# Patient Record
Sex: Male | Born: 1940 | Hispanic: Yes | Marital: Married | Smoking: Never smoker
Health system: Southern US, Community
[De-identification: ages and names within clinical notes are randomized; demographics above are authoritative.]

## PROBLEM LIST (undated history)

## (undated) DIAGNOSIS — I10 Essential (primary) hypertension: Secondary | ICD-10-CM

## (undated) DIAGNOSIS — I4891 Unspecified atrial fibrillation: Secondary | ICD-10-CM

## (undated) DIAGNOSIS — E785 Hyperlipidemia, unspecified: Secondary | ICD-10-CM

## (undated) DIAGNOSIS — E119 Type 2 diabetes mellitus without complications: Secondary | ICD-10-CM

## (undated) DIAGNOSIS — Z950 Presence of cardiac pacemaker: Secondary | ICD-10-CM

---

## 2005-09-21 ENCOUNTER — Other Ambulatory Visit: Payer: Self-pay

## 2005-09-21 ENCOUNTER — Inpatient Hospital Stay: Payer: Self-pay | Admitting: Internal Medicine

## 2005-09-22 ENCOUNTER — Other Ambulatory Visit: Payer: Self-pay

## 2005-09-23 ENCOUNTER — Other Ambulatory Visit: Payer: Self-pay

## 2007-03-22 ENCOUNTER — Other Ambulatory Visit: Payer: Self-pay

## 2007-03-22 ENCOUNTER — Emergency Department: Payer: Self-pay

## 2008-07-25 IMAGING — CT CT HEAD WITHOUT CONTRAST
2 series · 16 of 30 positions shown, 20 images · non-contrast
Comparison: none

REASON FOR EXAM: Transient altered mental status
COMMENTS:

PROCEDURE:     CT  - CT HEAD WITHOUT CONTRAST  - March 22, 2007  [DATE]
RESULT:     The patient has a history of transient altered mental status.
TECHNIQUE: Nonenhanced head CT is performed.

[Series 2: without · axial · non-contrast · 0.39mm/px · z∈[+888,+1018]mm · 13 of 32 slices shown, 17 images]
[im 3/32  brain]
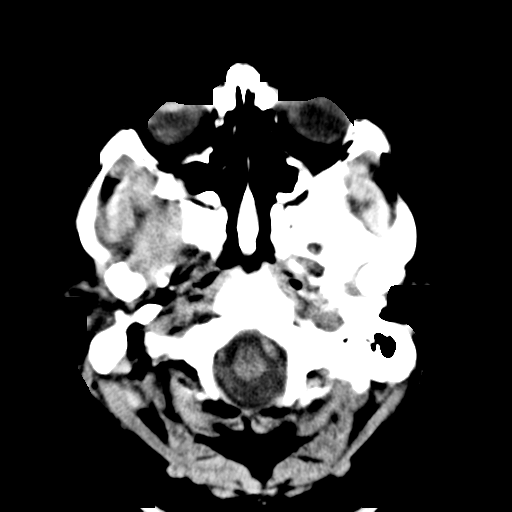
[im 3/32  bone]
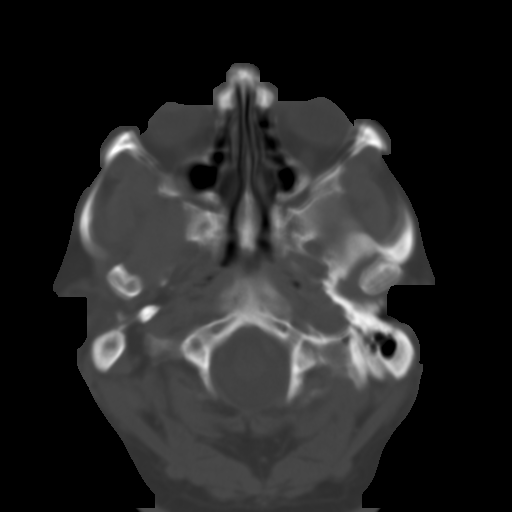
[im 5/32  brain]
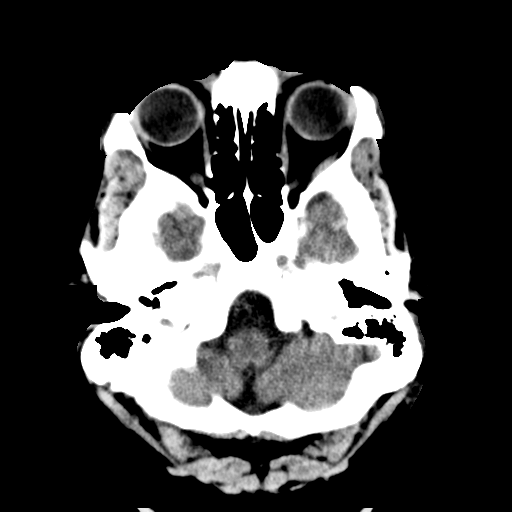
[im 7/32  brain]
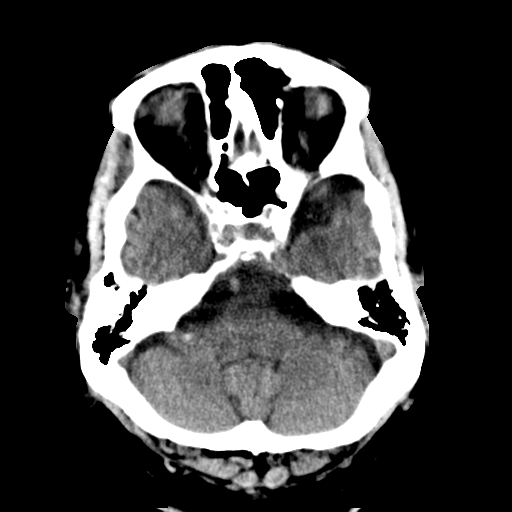
[im 9/32  brain]
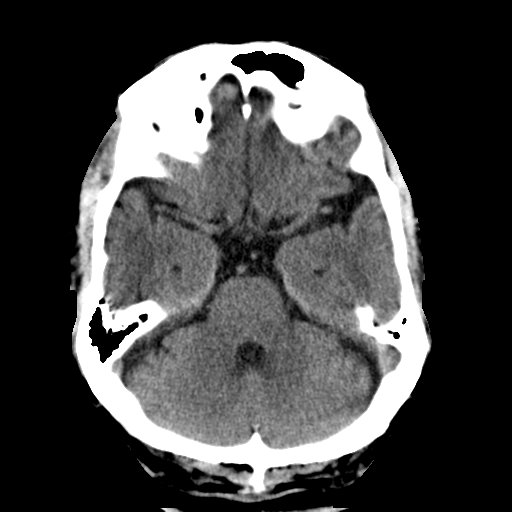
[im 12/32  brain]
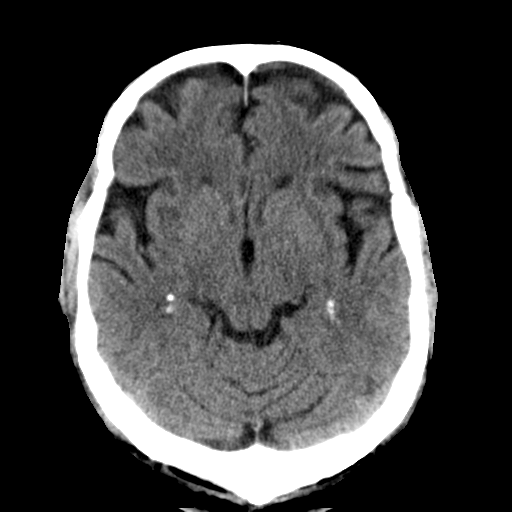
[im 12/32  bone]
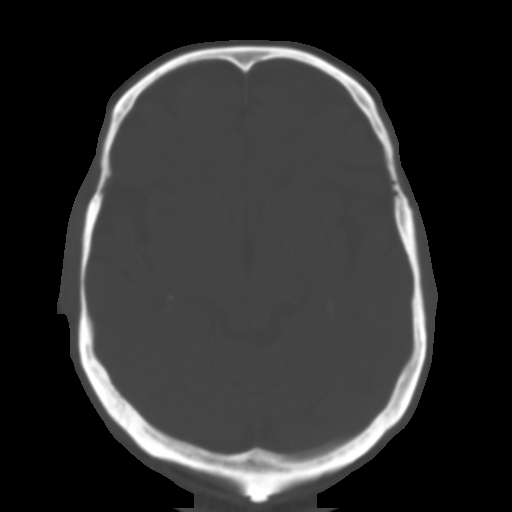
[im 14/32  brain]
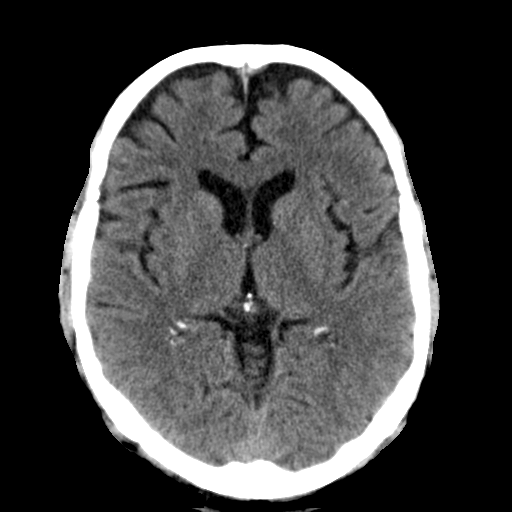
[im 16/32  brain]
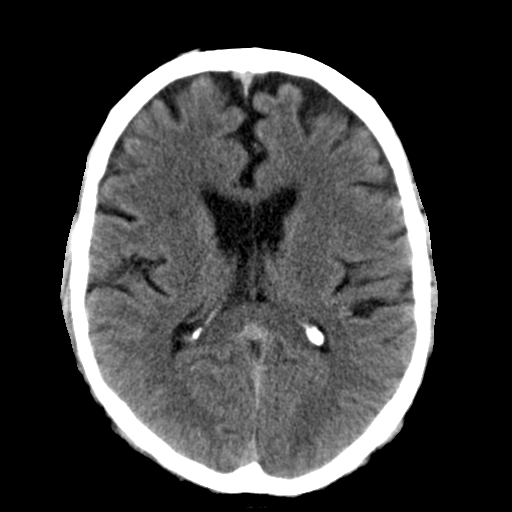
[im 18/32  brain]
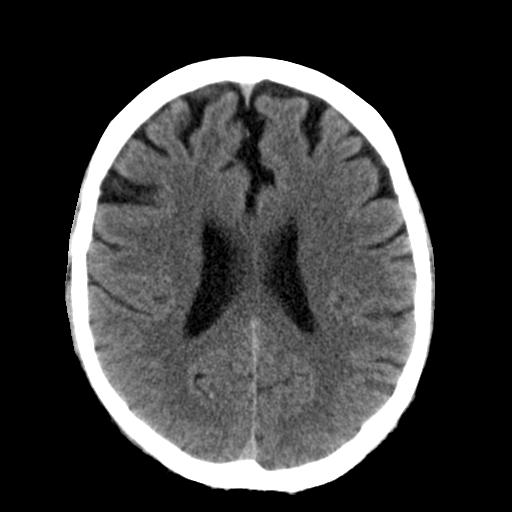
[im 20/32  brain]
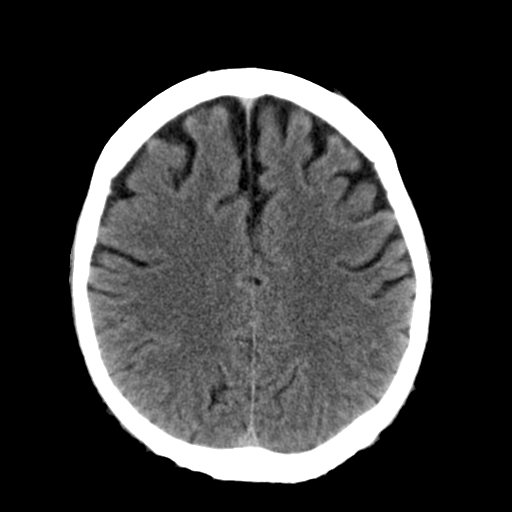
[im 20/32  bone]
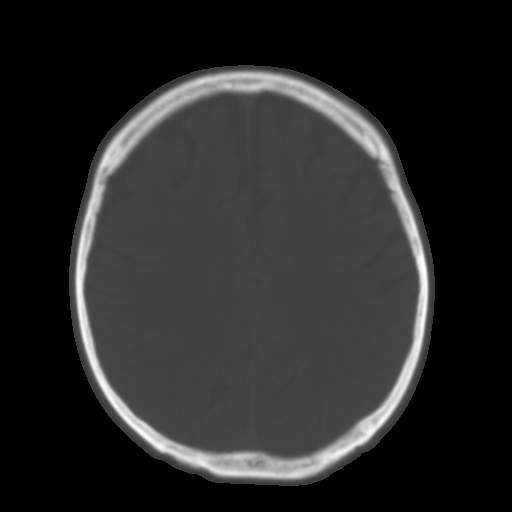
[im 23/32  brain]
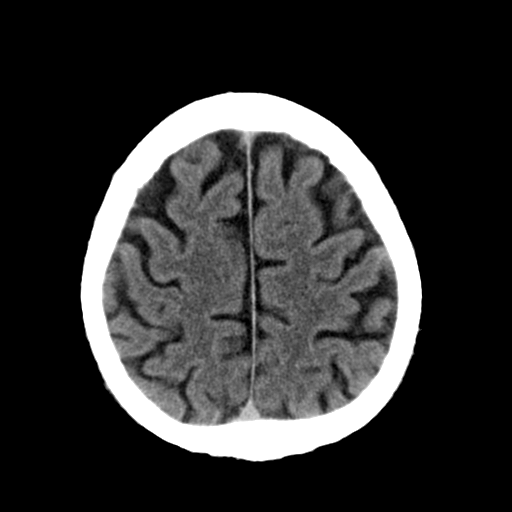
[im 25/32  brain]
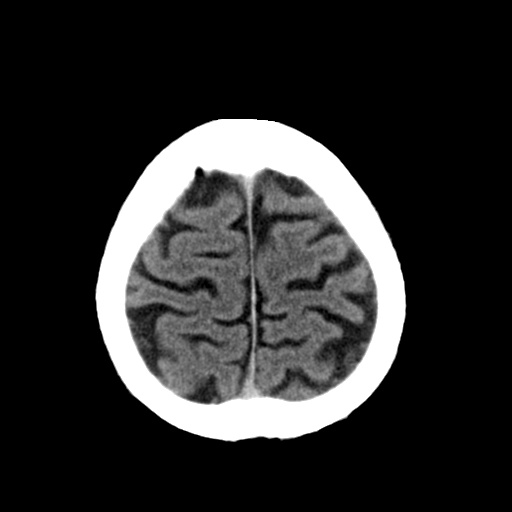
[im 27/32  brain]
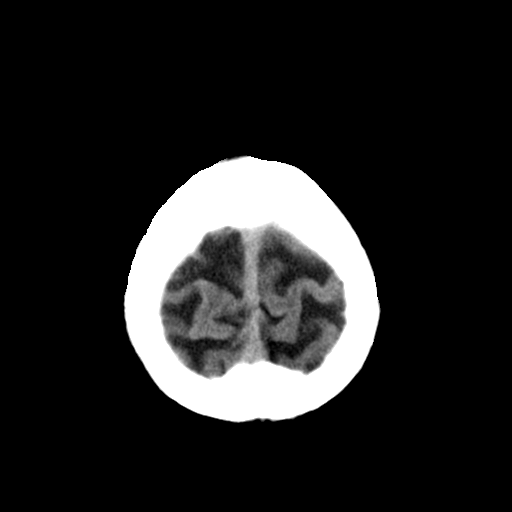
[im 29/32  brain]
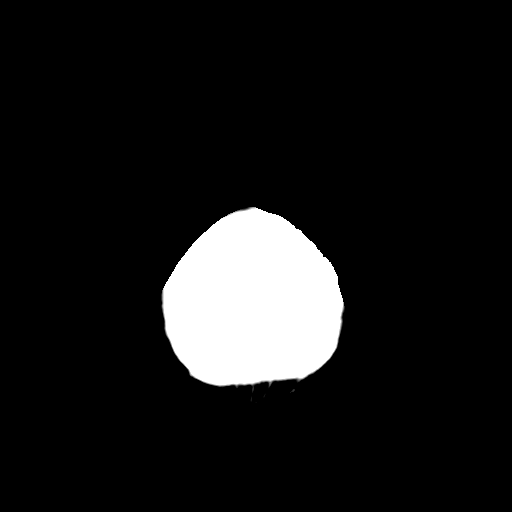
[im 29/32  bone]
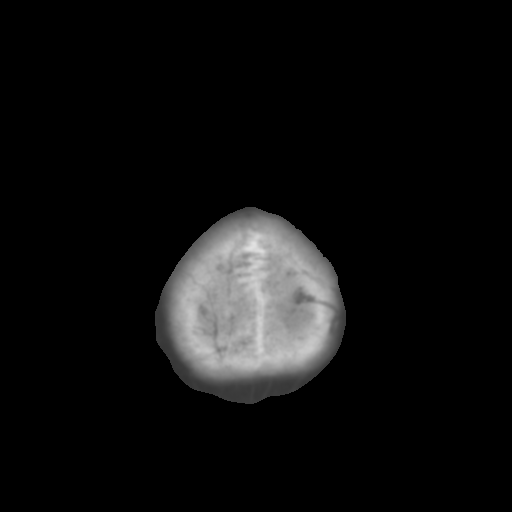

[Series 3: bone · axial · 0.39mm/px · z∈[+888,+933]mm · 3 of 32 slices shown]
[im 3/32  bone]
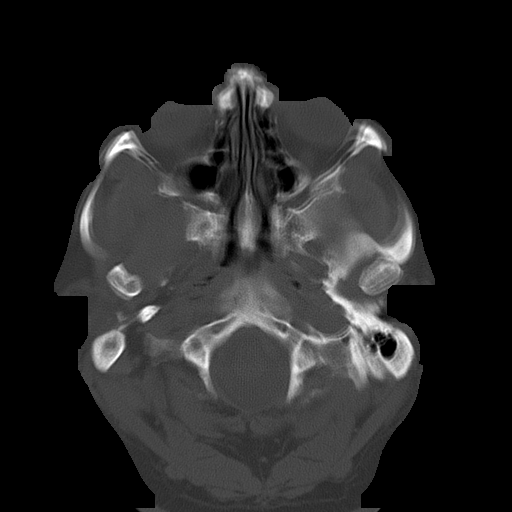
[im 7/32  bone]
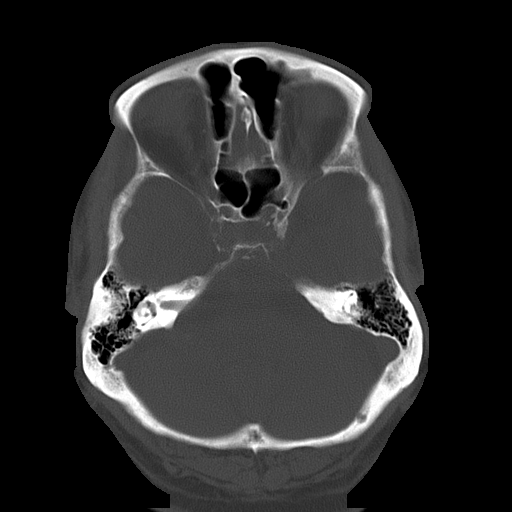
[im 12/32  bone]
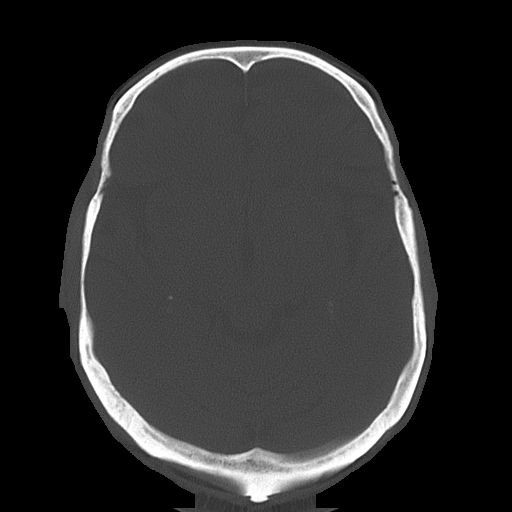

[16 of 30 positions shown; findings below may reference images not displayed]

FINDINGS: No intra-axial or extra-axial pathologic fluid collections are
identified. No mass lesion is noted. There is no hydrocephalus. No bony
abnormalities are identified.
IMPRESSION: No acute or focal abnormalities are identified.

## 2009-01-06 ENCOUNTER — Observation Stay: Payer: Self-pay | Admitting: Specialist

## 2009-05-30 ENCOUNTER — Emergency Department: Payer: Self-pay | Admitting: Emergency Medicine

## 2010-05-12 IMAGING — CR DG CHEST 1V PORT
1 series · 1 of 1 positions shown · non-contrast
Comparison: none

REASON FOR EXAM: Chest Pain
COMMENTS:

PROCEDURE:     DXR - DXR PORTABLE CHEST SINGLE VIEW  - January 06, 2009  [DATE]
RESULT:     Comparison is made to the study of 09/21/2005. The cardiac
silhouette is borderline enlarged. Atherosclerotic calcification is noted in
the aorta. There is no edema, infiltrate, effusion or pneumothorax.

[view not recorded]
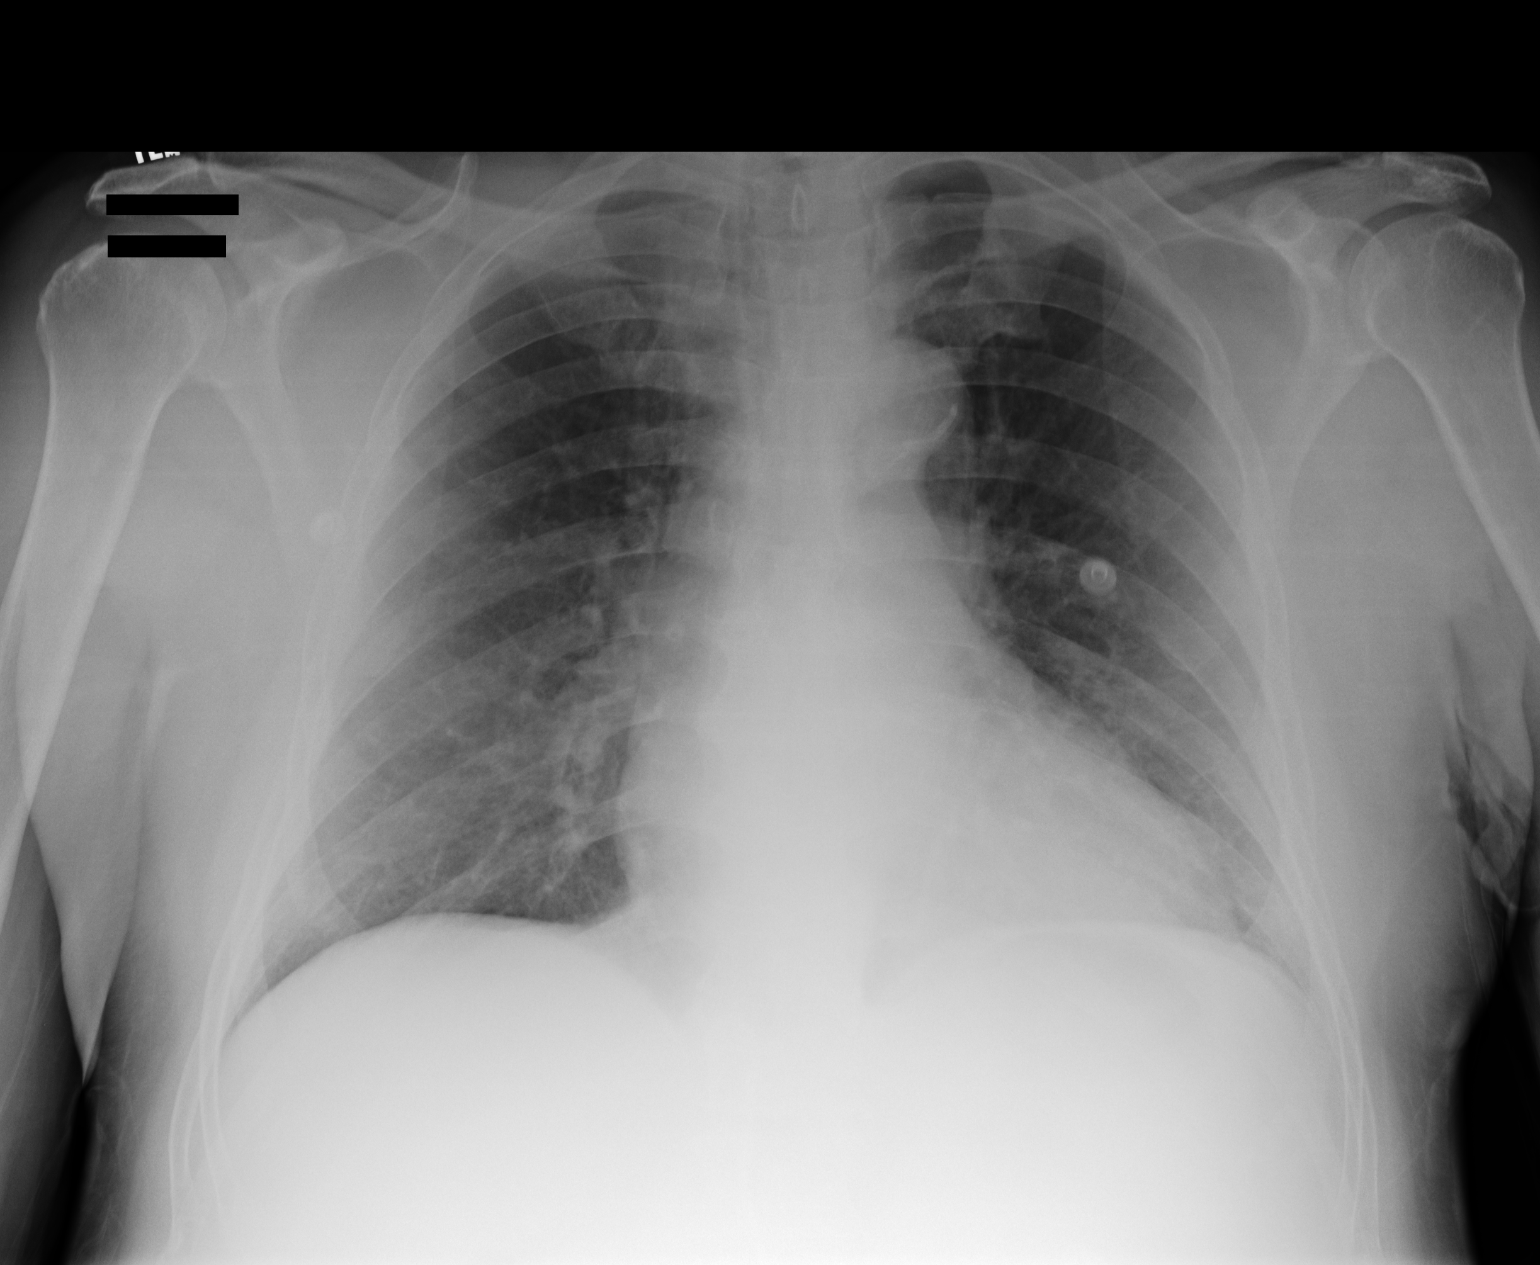

[1 of 1 positions shown; findings below may reference images not displayed]

IMPRESSION: No acute cardiopulmonary disease. Atherosclerotic
calcification present.

## 2010-10-23 ENCOUNTER — Emergency Department: Payer: Self-pay | Admitting: Unknown Physician Specialty

## 2010-11-13 ENCOUNTER — Emergency Department: Payer: Self-pay | Admitting: Internal Medicine

## 2011-01-23 ENCOUNTER — Ambulatory Visit: Payer: Self-pay

## 2013-06-16 ENCOUNTER — Emergency Department: Payer: Self-pay | Admitting: Emergency Medicine

## 2013-12-23 ENCOUNTER — Emergency Department: Payer: Self-pay | Admitting: Emergency Medicine

## 2013-12-23 LAB — BASIC METABOLIC PANEL
ANION GAP: 5 — AB (ref 7–16)
BUN: 16 mg/dL (ref 7–18)
CHLORIDE: 106 mmol/L (ref 98–107)
Calcium, Total: 8.7 mg/dL (ref 8.5–10.1)
Co2: 24 mmol/L (ref 21–32)
Creatinine: 1.23 mg/dL (ref 0.60–1.30)
EGFR (African American): >60
GFR CALC NON AF AMER: 57 — AB
Glucose: 232 mg/dL — ABNORMAL HIGH (ref 65–99)
Osmolality: 279 (ref 275–301)
Potassium: 4.1 mmol/L (ref 3.5–5.1)
Sodium: 135 mmol/L — ABNORMAL LOW (ref 136–145)

## 2013-12-23 LAB — CBC
HCT: 43.6 % (ref 40.0–52.0)
HGB: 14.8 g/dL (ref 13.0–18.0)
MCH: 32.4 pg (ref 26.0–34.0)
MCHC: 34 g/dL (ref 32.0–36.0)
MCV: 96 fL (ref 80–100)
PLATELETS: 155 10*3/uL (ref 150–440)
RBC: 4.56 10*6/uL (ref 4.40–5.90)
RDW: 13.3 % (ref 11.5–14.5)
WBC: 10.7 10*3/uL — ABNORMAL HIGH (ref 3.8–10.6)

## 2013-12-23 LAB — PROTIME-INR
INR: 1.5
Prothrombin Time: 17.4 secs — ABNORMAL HIGH (ref 11.5–14.7)

## 2013-12-23 LAB — TROPONIN I: Troponin-I: <0.02 ng/mL

## 2014-09-22 ENCOUNTER — Emergency Department: Payer: Self-pay | Admitting: Emergency Medicine

## 2018-05-14 ENCOUNTER — Emergency Department: Payer: Medicare Other

## 2018-05-14 ENCOUNTER — Other Ambulatory Visit: Payer: Self-pay

## 2018-05-14 ENCOUNTER — Inpatient Hospital Stay
Admission: EM | Admit: 2018-05-14 | Discharge: 2018-05-17 | DRG: 871 | Disposition: A | Discharge status: Home / Self Care | Payer: Medicare Other | Attending: Internal Medicine | Admitting: Internal Medicine

## 2018-05-14 DIAGNOSIS — I2510 Atherosclerotic heart disease of native coronary artery without angina pectoris: Secondary | ICD-10-CM | POA: Diagnosis present

## 2018-05-14 DIAGNOSIS — E1165 Type 2 diabetes mellitus with hyperglycemia: Secondary | ICD-10-CM | POA: Diagnosis present

## 2018-05-14 DIAGNOSIS — I110 Hypertensive heart disease with heart failure: Secondary | ICD-10-CM | POA: Diagnosis present

## 2018-05-14 DIAGNOSIS — I5022 Chronic systolic (congestive) heart failure: Secondary | ICD-10-CM | POA: Diagnosis present

## 2018-05-14 DIAGNOSIS — I482 Chronic atrial fibrillation, unspecified: Secondary | ICD-10-CM | POA: Diagnosis present

## 2018-05-14 DIAGNOSIS — R739 Hyperglycemia, unspecified: Secondary | ICD-10-CM

## 2018-05-14 DIAGNOSIS — E785 Hyperlipidemia, unspecified: Secondary | ICD-10-CM | POA: Diagnosis present

## 2018-05-14 DIAGNOSIS — Z794 Long term (current) use of insulin: Secondary | ICD-10-CM | POA: Diagnosis not present

## 2018-05-14 DIAGNOSIS — Z955 Presence of coronary angioplasty implant and graft: Secondary | ICD-10-CM

## 2018-05-14 DIAGNOSIS — Z7982 Long term (current) use of aspirin: Secondary | ICD-10-CM | POA: Diagnosis not present

## 2018-05-14 DIAGNOSIS — Z833 Family history of diabetes mellitus: Secondary | ICD-10-CM

## 2018-05-14 DIAGNOSIS — I509 Heart failure, unspecified: Secondary | ICD-10-CM

## 2018-05-14 DIAGNOSIS — Z79899 Other long term (current) drug therapy: Secondary | ICD-10-CM | POA: Diagnosis not present

## 2018-05-14 DIAGNOSIS — R0602 Shortness of breath: Secondary | ICD-10-CM | POA: Diagnosis present

## 2018-05-14 DIAGNOSIS — J45909 Unspecified asthma, uncomplicated: Secondary | ICD-10-CM | POA: Diagnosis present

## 2018-05-14 DIAGNOSIS — A419 Sepsis, unspecified organism: Principal | ICD-10-CM | POA: Diagnosis present

## 2018-05-14 DIAGNOSIS — Z950 Presence of cardiac pacemaker: Secondary | ICD-10-CM

## 2018-05-14 DIAGNOSIS — J181 Lobar pneumonia, unspecified organism: Secondary | ICD-10-CM | POA: Diagnosis present

## 2018-05-14 DIAGNOSIS — Z7901 Long term (current) use of anticoagulants: Secondary | ICD-10-CM

## 2018-05-14 DIAGNOSIS — J189 Pneumonia, unspecified organism: Secondary | ICD-10-CM | POA: Diagnosis present

## 2018-05-14 HISTORY — DX: Presence of cardiac pacemaker: Z95.0

## 2018-05-14 HISTORY — DX: Essential (primary) hypertension: I10

## 2018-05-14 HISTORY — DX: Unspecified atrial fibrillation: I48.91

## 2018-05-14 HISTORY — DX: Type 2 diabetes mellitus without complications: E11.9

## 2018-05-14 HISTORY — DX: Hyperlipidemia, unspecified: E78.5

## 2018-05-14 LAB — HEMOGLOBIN A1C
Hgb A1c MFr Bld: 6.5 % — ABNORMAL HIGH (ref 4.8–5.6)
Mean Plasma Glucose: 139.85 mg/dL

## 2018-05-14 LAB — CBC
HCT: 35.5 % — ABNORMAL LOW (ref 39.0–52.0)
HEMOGLOBIN: 12 g/dL — AB (ref 13.0–17.0)
MCH: 32.1 pg (ref 26.0–34.0)
MCHC: 33.8 g/dL (ref 30.0–36.0)
MCV: 94.9 fL (ref 80.0–100.0)
NRBC: 0 % (ref 0.0–0.2)
PLATELETS: 158 10*3/uL (ref 150–400)
RBC: 3.74 MIL/uL — AB (ref 4.22–5.81)
RDW: 12.6 % (ref 11.5–15.5)
WBC: 12.3 10*3/uL — ABNORMAL HIGH (ref 4.0–10.5)

## 2018-05-14 LAB — GLUCOSE, CAPILLARY
Glucose-Capillary: 277 mg/dL — ABNORMAL HIGH (ref 70–99)
Glucose-Capillary: 221 mg/dL — ABNORMAL HIGH (ref 70–99)

## 2018-05-14 LAB — BLOOD GAS, VENOUS
Acid-base deficit: 1.6 mmol/L (ref 0.0–2.0)
Bicarbonate: 23 mmol/L (ref 20.0–28.0)
O2 SAT: 73 %
PATIENT TEMPERATURE: 37
PO2 VEN: 39 mmHg (ref 32.0–45.0)
pCO2, Ven: 38 mmHg — ABNORMAL LOW (ref 44.0–60.0)
pH, Ven: 7.39 (ref 7.250–7.430)

## 2018-05-14 LAB — HEPATIC FUNCTION PANEL
ALT: 29 U/L (ref 0–44)
AST: 35 U/L (ref 15–41)
Albumin: 4 g/dL (ref 3.5–5.0)
Alkaline Phosphatase: 74 U/L (ref 38–126)
BILIRUBIN DIRECT: 0.2 mg/dL (ref 0.0–0.2)
BILIRUBIN INDIRECT: 0.7 mg/dL (ref 0.3–0.9)
BILIRUBIN TOTAL: 0.9 mg/dL (ref 0.3–1.2)
Total Protein: 7.6 g/dL (ref 6.5–8.1)

## 2018-05-14 LAB — BASIC METABOLIC PANEL
Anion gap: 11 (ref 5–15)
BUN: 22 mg/dL (ref 8–23)
CALCIUM: 8.7 mg/dL — AB (ref 8.9–10.3)
CO2: 24 mmol/L (ref 22–32)
CREATININE: 1.14 mg/dL (ref 0.61–1.24)
Chloride: 98 mmol/L (ref 98–111)
GFR, EST NON AFRICAN AMERICAN: 59 mL/min — AB (ref >60)
GLUCOSE: 241 mg/dL — AB (ref 70–99)
Potassium: 4.2 mmol/L (ref 3.5–5.1)
SODIUM: 133 mmol/L — AB (ref 135–145)

## 2018-05-14 LAB — TROPONIN I: Troponin I: <0.03 ng/mL (ref <0.03)

## 2018-05-14 LAB — BRAIN NATRIURETIC PEPTIDE: B Natriuretic Peptide: 220 pg/mL — ABNORMAL HIGH (ref 0.0–100.0)

## 2018-05-14 LAB — INFLUENZA PANEL BY PCR (TYPE A & B)
INFLAPCR: NEGATIVE
Influenza B By PCR: NEGATIVE

## 2018-05-14 LAB — LACTIC ACID, PLASMA: LACTIC ACID, VENOUS: 2.4 mmol/L — AB (ref 0.5–1.9)

## 2018-05-14 MED ORDER — AMLODIPINE BESYLATE 10 MG PO TABS
10.0000 mg | ORAL_TABLET | Freq: Every day | ORAL | Status: DC
  Administered 2018-05-15 – 2018-05-17 (×3): 10 mg via ORAL
  Filled 2018-05-14 (×3): qty 1

## 2018-05-14 MED ORDER — IPRATROPIUM-ALBUTEROL 0.5-2.5 (3) MG/3ML IN SOLN
3.0000 mL | Freq: Once | RESPIRATORY_TRACT | Status: AC
  Administered 2018-05-14: 3 mL via RESPIRATORY_TRACT
  Filled 2018-05-14: qty 3

## 2018-05-14 MED ORDER — SODIUM CHLORIDE 0.9 % IV SOLN
1.0000 g | Freq: Once | INTRAVENOUS | Status: AC
  Administered 2018-05-14: 1 g via INTRAVENOUS
  Filled 2018-05-14: qty 10

## 2018-05-14 MED ORDER — INSULIN ASPART PROT & ASPART (70-30 MIX) 100 UNIT/ML ~~LOC~~ SUSP
10.0000 [IU] | Freq: Two times a day (BID) | SUBCUTANEOUS | Status: DC
  Administered 2018-05-14: 10 [IU] via SUBCUTANEOUS
  Filled 2018-05-14: qty 10

## 2018-05-14 MED ORDER — ACETAMINOPHEN 650 MG RE SUPP: 650.0000 mg | Freq: Four times a day (QID) | RECTAL | Status: DC | PRN

## 2018-05-14 MED ORDER — ONDANSETRON HCL 4 MG PO TABS: 4.0000 mg | ORAL_TABLET | Freq: Four times a day (QID) | ORAL | Status: DC | PRN

## 2018-05-14 MED ORDER — ONDANSETRON HCL 4 MG/2ML IJ SOLN: 4.0000 mg | Freq: Four times a day (QID) | INTRAMUSCULAR | Status: DC | PRN

## 2018-05-14 MED ORDER — METFORMIN HCL 500 MG PO TABS
1000.0000 mg | ORAL_TABLET | Freq: Two times a day (BID) | ORAL | Status: DC
  Administered 2018-05-16 – 2018-05-17 (×3): 1000 mg via ORAL
  Filled 2018-05-14 (×3): qty 2

## 2018-05-14 MED ORDER — ACETAMINOPHEN 325 MG PO TABS
650.0000 mg | ORAL_TABLET | Freq: Four times a day (QID) | ORAL | Status: DC | PRN
  Administered 2018-05-15: 650 mg via ORAL
  Filled 2018-05-14: qty 2

## 2018-05-14 MED ORDER — DEXTROSE 5 % IV SOLN
250.0000 mg | INTRAVENOUS | Status: DC
  Administered 2018-05-15 – 2018-05-17 (×3): 250 mg via INTRAVENOUS
  Filled 2018-05-14 (×3): qty 250

## 2018-05-14 MED ORDER — INSULIN ASPART 100 UNIT/ML ~~LOC~~ SOLN
0.0000 [IU] | Freq: Three times a day (TID) | SUBCUTANEOUS | Status: DC
  Administered 2018-05-14: 5 [IU] via SUBCUTANEOUS
  Administered 2018-05-15: 3 [IU] via SUBCUTANEOUS
  Administered 2018-05-15: 5 [IU] via SUBCUTANEOUS
  Administered 2018-05-16: 3 [IU] via SUBCUTANEOUS
  Administered 2018-05-16: 5 [IU] via SUBCUTANEOUS
  Administered 2018-05-16: 2 [IU] via SUBCUTANEOUS
  Filled 2018-05-14 (×6): qty 1

## 2018-05-14 MED ORDER — SODIUM CHLORIDE 0.9 % IV SOLN
500.0000 mg | Freq: Once | INTRAVENOUS | Status: AC
  Administered 2018-05-14: 500 mg via INTRAVENOUS
  Filled 2018-05-14: qty 500

## 2018-05-14 MED ORDER — SODIUM CHLORIDE 0.9 % IV BOLUS
1000.0000 mL | Freq: Once | INTRAVENOUS | Status: AC
  Administered 2018-05-14: 1000 mL via INTRAVENOUS

## 2018-05-14 MED ORDER — SODIUM CHLORIDE 0.9 % IV SOLN
1.0000 g | INTRAVENOUS | Status: DC
  Administered 2018-05-15 – 2018-05-17 (×3): 1 g via INTRAVENOUS
  Filled 2018-05-14 (×3): qty 1

## 2018-05-14 MED ORDER — RIVAROXABAN 20 MG PO TABS
20.0000 mg | ORAL_TABLET | Freq: Every day | ORAL | Status: DC
  Administered 2018-05-14: 20 mg via ORAL
  Filled 2018-05-14: qty 1

## 2018-05-14 MED ORDER — LOSARTAN POTASSIUM 50 MG PO TABS
100.0000 mg | ORAL_TABLET | Freq: Every day | ORAL | Status: DC
  Administered 2018-05-14 – 2018-05-16 (×3): 100 mg via ORAL
  Filled 2018-05-14 (×3): qty 2

## 2018-05-14 MED ORDER — ATORVASTATIN CALCIUM 20 MG PO TABS
40.0000 mg | ORAL_TABLET | Freq: Every day | ORAL | Status: DC
  Administered 2018-05-15 – 2018-05-17 (×3): 40 mg via ORAL
  Filled 2018-05-14 (×3): qty 2

## 2018-05-14 MED ORDER — GUAIFENESIN 100 MG/5ML PO SOLN
5.0000 mL | ORAL | Status: DC | PRN
  Administered 2018-05-15 – 2018-05-16 (×4): 100 mg via ORAL
  Filled 2018-05-14 (×6): qty 5

## 2018-05-14 MED ORDER — IPRATROPIUM-ALBUTEROL 0.5-2.5 (3) MG/3ML IN SOLN
3.0000 mL | Freq: Four times a day (QID) | RESPIRATORY_TRACT | Status: DC
  Administered 2018-05-14 – 2018-05-15 (×5): 3 mL via RESPIRATORY_TRACT
  Filled 2018-05-14 (×5): qty 3

## 2018-05-14 MED ORDER — BUDESONIDE 0.5 MG/2ML IN SUSP
0.5000 mg | Freq: Two times a day (BID) | RESPIRATORY_TRACT | Status: DC
  Administered 2018-05-14 – 2018-05-17 (×6): 0.5 mg via RESPIRATORY_TRACT
  Filled 2018-05-14 (×6): qty 2

## 2018-05-14 MED ORDER — CARVEDILOL 6.25 MG PO TABS
6.2500 mg | ORAL_TABLET | Freq: Two times a day (BID) | ORAL | Status: DC
  Administered 2018-05-14 – 2018-05-17 (×6): 6.25 mg via ORAL
  Filled 2018-05-14 (×6): qty 1

## 2018-05-14 MED ORDER — ASPIRIN 81 MG PO CHEW
81.0000 mg | CHEWABLE_TABLET | Freq: Every day | ORAL | Status: DC
  Administered 2018-05-15 – 2018-05-17 (×3): 81 mg via ORAL
  Filled 2018-05-14 (×3): qty 1

## 2018-05-14 MED ORDER — ALPRAZOLAM 0.5 MG PO TABS
0.2500 mg | ORAL_TABLET | Freq: Three times a day (TID) | ORAL | Status: DC | PRN
  Administered 2018-05-14 – 2018-05-15 (×3): 0.25 mg via ORAL
  Filled 2018-05-14 (×3): qty 1

## 2018-05-14 MED ORDER — INSULIN ASPART 100 UNIT/ML ~~LOC~~ SOLN
0.0000 [IU] | Freq: Every day | SUBCUTANEOUS | Status: DC
  Administered 2018-05-14 – 2018-05-15 (×2): 2 [IU] via SUBCUTANEOUS
  Filled 2018-05-14 (×2): qty 1

## 2018-05-14 MED ORDER — ALBUTEROL SULFATE (2.5 MG/3ML) 0.083% IN NEBU
3.0000 mL | INHALATION_SOLUTION | Freq: Four times a day (QID) | RESPIRATORY_TRACT | Status: DC | PRN
  Administered 2018-05-15 – 2018-05-16 (×2): 3 mL via RESPIRATORY_TRACT
  Filled 2018-05-14 (×2): qty 3

## 2018-05-14 NOTE — ED Notes (Signed)
Patient ambulatory to Rm 8, bed in low position.  Gown given to patient and siderails up, warm blankets provided.

## 2018-05-14 NOTE — H&P (Signed)
[employer] - [place] at [facility]    PATIENT NAME: Evan Ruiz    MR#:  [id number]  DATE OF BIRTH:  Jun 09, 1941  DATE OF ADMISSION:  05/14/2018  PRIMARY CARE PHYSICIAN: Patient, No Pcp Per   REQUESTING/REFERRING PHYSICIAN: Dr. Virgilio Frees  CHIEF COMPLAINT:   Chief Complaint  Patient presents with  . Shortness of Breath  . Cough    HISTORY OF PRESENT ILLNESS:  Charvez Voorhies  is a 77 y.o. male with a known history of hypertension, hyperlipidemia, coronary artery disease status post stent placement, atrial fibrillation status post biventricular pacemaker, diabetes, chronic systolic CHF who presents to the hospital due to shortness of breath cough ongoing for the past week to 10 days.  Patient says he has not been feeling well for the past week to 10 days with increasing shortness of breath on exertion along with a cough which is been productive intermittently with clear sputum.  He also apparently had a fever at home but did not measure his temperature but felt really cold.  He presents to the hospital underwent a chest x-ray which showed a right upper lobe pneumonia and hospitalist services were contacted for admission.  PAST MEDICAL HISTORY:   Past Medical History:  Diagnosis Date  . Atrial fibrillation (HCC)   . Diabetes mellitus without complication (HCC)   . Hyperlipidemia   . Hypertension   . Pacemaker     PAST SURGICAL HISTORY:  History reviewed. No pertinent surgical history.  SOCIAL HISTORY:   Social History   Tobacco Use  . Smoking status: Never Smoker  Substance Use Topics  . Alcohol use: Yes    Frequency: Never    Comment: socially    FAMILY HISTORY:   Family History  Problem Relation Age of Onset  . Diabetes Father     DRUG ALLERGIES:  No Known Allergies  REVIEW OF SYSTEMS:   Review of Systems  Constitutional: Negative for fever and weight loss.  HENT: Negative for congestion, nosebleeds and tinnitus.   Eyes: Negative for  blurred vision, double vision and redness.  Respiratory: Negative for cough, hemoptysis and shortness of breath.   Cardiovascular: Negative for chest pain, orthopnea, leg swelling and PND.  Gastrointestinal: Negative for abdominal pain, diarrhea, melena, nausea and vomiting.  Genitourinary: Negative for dysuria, hematuria and urgency.  Musculoskeletal: Negative for falls and joint pain.  Neurological: Negative for dizziness, tingling, sensory change, focal weakness, seizures, weakness and headaches.  Endo/Heme/Allergies: Negative for polydipsia. Does not bruise/bleed easily.  Psychiatric/Behavioral: Negative for depression and memory loss. The patient is not nervous/anxious.     MEDICATIONS AT HOME:   No current facility-administered medications for this encounter.    Current Outpatient Medications  Medication Sig Dispense Refill Last Dose  . albuterol (PROVENTIL HFA;VENTOLIN HFA) 108 (90 Base) MCG/ACT inhaler Inhale 2 puffs into the lungs every 6 (six) hours as needed for wheezing or shortness of breath.    prn at prn  . amLODipine (NORVASC) 10 MG tablet Take 10 mg by mouth daily.   05/14/2018 at 0800  . aspirin 81 MG chewable tablet Chew 81 mg by mouth daily.   05/14/2018 at 0800  . atorvastatin (LIPITOR) 40 MG tablet Take 40 mg by mouth daily.   05/14/2018 at 0800  . carvedilol (COREG) 6.25 MG tablet Take 6.25 mg by mouth 2 (two) times daily with a meal.   05/14/2018 at 0800  . furosemide (LASIX) 40 MG tablet Take 40 mg by mouth daily as needed (leg  swelling).   prn at prn  . insulin NPH-regular Human (HUMULIN 70/30) (70-30) 100 UNIT/ML injection Inject 10 Units into the skin 2 (two) times daily.   05/14/2018 at 0800  . losartan (COZAAR) 50 MG tablet Take 100 mg by mouth daily.   05/13/2018 at 1800  . metFORMIN (GLUCOPHAGE) 500 MG tablet Take 1,000 mg by mouth 2 (two) times daily with a meal.   05/14/2018 at 0800  . rivaroxaban (XARELTO) 20 MG TABS tablet Take 20 mg by mouth daily with supper.    05/13/2018 at 1800     VITAL SIGNS:  Blood pressure (!) 128/48, pulse 82, temperature 99.7 F (37.6 C), temperature source Oral, resp. rate 18, height 5\' 5"  (1.651 m), weight 65 kg, SpO2 95 %.  PHYSICAL EXAMINATION:  Physical Exam  GENERAL:  77 y.o.-year-old patient lying in the bed in no acute distress.  EYES: Pupils equal, round, reactive to light and accommodation. No scleral icterus. Extraocular muscles intact.  HEENT: Head atraumatic, normocephalic. Oropharynx and nasopharynx clear. No oropharyngeal erythema, moist oral mucosa  NECK:  Supple, no jugular venous distention. No thyroid enlargement, no tenderness.  LUNGS: Normal breath sounds bilaterally, diffuse insp. & exp. Wheezing b/l, No rales, rhonchi. No use of accessory muscles of respiration.  CARDIOVASCULAR: S1, S2 RRR. No murmurs, rubs, gallops, clicks.  ABDOMEN: Soft, nontender, nondistended. Bowel sounds present. No organomegaly or mass.  EXTREMITIES: No pedal edema, cyanosis, or clubbing. + 2 pedal & radial pulses b/l.   NEUROLOGIC: Cranial nerves II through XII are intact. No focal Motor or sensory deficits appreciated b/l PSYCHIATRIC: The patient is alert and oriented x 3.  SKIN: No obvious rash, lesion, or ulcer.   LABORATORY PANEL:   CBC Recent Labs  Lab 05/14/18 1046  WBC 12.3*  HGB 12.0*  HCT 35.5*  PLT 158   ------------------------------------------------------------------------------------------------------------------  Chemistries  Recent Labs  Lab 05/14/18 1046  NA 133*  K 4.2  CL 98  CO2 24  GLUCOSE 241*  BUN 22  CREATININE 1.14  CALCIUM 8.7*  AST 35  ALT 29  ALKPHOS 74  BILITOT 0.9   ------------------------------------------------------------------------------------------------------------------  Cardiac Enzymes Recent Labs  Lab 05/14/18 1046  TROPONINI <0.03    ------------------------------------------------------------------------------------------------------------------  RADIOLOGY:  Dg Chest 2 View  Result Date: 05/14/2018 CLINICAL DATA:  Chest pain, cough, shortness of breath EXAM: CHEST - 2 VIEW COMPARISON:  09/22/2014 FINDINGS: Consolidation in the right upper lung concerning for pneumonia. Cardiomegaly. Left pacer remains in place, unchanged. Mild vascular congestion. No effusions or acute bony abnormality. IMPRESSION: Cardiomegaly, vascular congestion. Consolidation in the right upper lung concerning for pneumonia. Followup PA and lateral chest X-ray is recommended in 3-4 weeks following trial of antibiotic therapy to ensure resolution and exclude underlying malignancy. Electronically Signed   By: Charlett Nose M.D.   On: 05/14/2018 11:16     IMPRESSION AND PLAN:   77 year old male with past medical history of atrial fibrillation, coronary artery disease status post stent placement, status post biventricular pacemaker, chronic systolic CHF, hypertension, hyperlipidemia, diabetes who presents to the hospital due to shortness of breath, cough and noted to have pneumonia.  1.  Pneumonia-this is a cause of patient's shortness of breath and cough which has not improved over the past 10 days.  Patient's chest x-ray is showing a right upper lobe pneumonia. - We will treat patient with IV ceftriaxone, Zithromax.  Follow cultures.  2.  Wheezing, bronchospasm-secondary to pneumonia. - I will place the patient on some scheduled duo nebs  and Pulmicort nebs.  3.  Diabetes type 2 without complication-continue patient's Novolin 70/30 mix. -We will continue metformin, add some sliding scale insulin. - will check A1c.   4.  History of atrial fibrillation-patient is status post pacemaker. -Currently rate controlled, continue carvedilol, continue Xarelto.  5.  Essential hypertension-continue carvedilol, amlodipine, losartan.  6.  History of chronic  systolic CHF- continue Coreg, Lasix, losartan.  7.  Hyperlipidemia-continue atorvastatin.    All the records are reviewed and case discussed with ED provider. Management plans discussed with the patient, family and they are in agreement.  CODE STATUS: Full code  TOTAL TIME TAKING CARE OF THIS PATIENT: 45 minutes.    Houston Siren M.D on 05/14/2018 at 1:30 PM  Between 7am to 6pm - Pager - [phone]  After 6pm go to [email or web address] - password [email or web address]  [facility]  Office  [phone]  CC: Primary care physician; Patient, No Pcp Per

## 2018-05-14 NOTE — ED Triage Notes (Signed)
[employer] interpreter used for interpretation at time of triage. Pt to ER via POV c/o chest pain and cough X 8 days, fever.

## 2018-05-14 NOTE — ED Provider Notes (Addendum)
[facility] Emergency Department Provider Note  ____________________________________________  Time seen: Approximately 11:18 AM  I have reviewed the triage vital signs and the nursing notes.   HISTORY  Chief Complaint Shortness of Breath and Cough    HPI Evan Ruiz is a 77 y.o. male history of DM, status post pacemaker, presenting with fever, cough, shortness of breath.  The patient reports that for the last several days, he has been having a nonproductive cough with congestion and rhinorrhea.  No sore throat or ear pain.  He has had subjective fevers at home.  He has also noted wheezing and exertional shortness of breath as well as orthopnea.  If he sits up and rests, his shortness of breath improved.  He has not had any chest pain, pressure or heaviness.  He has had some lower extremity swelling, which is not new.  No calf pain.  Past Medical History:  Diagnosis Date  . Atrial fibrillation (HCC)   . Diabetes mellitus without complication (HCC)   . Hyperlipidemia   . Hypertension   . Pacemaker     Patient Active Problem List   Diagnosis Date Noted  . Pneumonia 05/14/2018    History reviewed. No pertinent surgical history.    Allergies Patient has no known allergies.  Family History  Problem Relation Age of Onset  . Diabetes Father     Social History Social History   Tobacco Use  . Smoking status: Never Smoker  Substance Use Topics  . Alcohol use: Yes    Frequency: Never    Comment: socially  . Drug use: Never    Review of Systems Constitutional: Positive fever.  No lightheadedness or syncope.  Positive decreased exercise tolerance. Eyes: No visual changes.  No eye discharge. ENT: No sore throat.  Positive congestion and rhinorrhea. Cardiovascular: Denies chest pain. Denies palpitations. Respiratory: Positive shortness of breath.  Positive cough. Gastrointestinal: No abdominal pain.  No nausea, no vomiting.  No diarrhea.  No  constipation. Genitourinary: Negative for dysuria. Musculoskeletal: Negative for back pain. Skin: Negative for rash. Neurological: Negative for headaches. No focal numbness, tingling or weakness.     ____________________________________________   PHYSICAL EXAM:  VITAL SIGNS: ED Triage Vitals  Enc Vitals Group     BP 05/14/18 1038 (!) 128/48     Pulse Rate 05/14/18 1038 82     Resp 05/14/18 1038 18     Temp 05/14/18 1038 99.7 F (37.6 C)     Temp Source 05/14/18 1038 Oral     SpO2 05/14/18 1038 95 %     Weight 05/14/18 1041 143 lb 4.8 oz (65 kg)     Height 05/14/18 1041 5\' 5"  (1.651 m)     Head Circumference --      Peak Flow --      Pain Score 05/14/18 1041 0     Pain Loc --      Pain Edu? --      Excl. in GC? --     Constitutional: Alert and oriented. Answers questions appropriately.  Chronically ill-appearing.  Uncomfortable appearing. Eyes: Conjunctivae are normal.  EOMI. No scleral icterus.  No eye discharge. Head: Atraumatic. Nose: Positive congestion without rhinorrhea. Mouth/Throat: Mucous membranes are mildly dry.  Neck: No stridor.  Supple.  No JVD.  No meningismus. Cardiovascular: Normal rate, regular rhythm. No murmurs, rubs or gallops.  Respiratory: Normal respiratory effort without accessory muscle use or retractions.  The patient does have end expiratory wheezing without rales or rhonchi.  He is able to speak in full sentences without difficulty. Gastrointestinal: Soft, nontender and nondistended.  No guarding or rebound.  No peritoneal signs. Musculoskeletal: Positive symmetric mildly pitting LE edema around the ankles bilaterally. No ttp in the calves or palpable cords.  Negative Homan's sign. Neurologic:  A&Ox3.  Speech is clear.  Face and smile are symmetric.  EOMI.  Moves all extremities well. Skin:  Skin is warm, dry and intact. No rash noted. Psychiatric: Mood and affect are normal. Speech and behavior are normal.  Normal  judgement.  ____________________________________________   LABS (all labs ordered are listed, but only abnormal results are displayed)  Labs Reviewed  CBC - Abnormal; Notable for the following components:      Result Value   WBC 12.3 (*)    RBC 3.74 (*)    Hemoglobin 12.0 (*)    HCT 35.5 (*)    All other components within normal limits  BASIC METABOLIC PANEL - Abnormal; Notable for the following components:   Sodium 133 (*)    Glucose, Bld 241 (*)    Calcium 8.7 (*)    GFR calc non Af Amer 59 (*)    All other components within normal limits  BRAIN NATRIURETIC PEPTIDE - Abnormal; Notable for the following components:   B Natriuretic Peptide 220.0 (*)    All other components within normal limits  BLOOD GAS, VENOUS - Abnormal; Notable for the following components:   pCO2, Ven 38 (*)    All other components within normal limits  LACTIC ACID, PLASMA - Abnormal; Notable for the following components:   Lactic Acid, Venous 2.4 (*)    All other components within normal limits  CULTURE, BLOOD (ROUTINE X 2)  CULTURE, BLOOD (ROUTINE X 2)  HEPATIC FUNCTION PANEL  TROPONIN I  INFLUENZA PANEL BY PCR (TYPE A & B)  LACTIC ACID, PLASMA  HEMOGLOBIN A1C   ____________________________________________  EKG  ED ECG REPORT I, Anne-Caroline Sharma Covert, the attending physician, personally viewed and interpreted this ECG.   Date: 05/14/2018  EKG Time: 1029  Rate: 92  Rhythm: paced    ____________________________________________  RADIOLOGY  Dg Chest 2 View  Result Date: 05/14/2018 CLINICAL DATA:  Chest pain, cough, shortness of breath EXAM: CHEST - 2 VIEW COMPARISON:  09/22/2014 FINDINGS: Consolidation in the right upper lung concerning for pneumonia. Cardiomegaly. Left pacer remains in place, unchanged. Mild vascular congestion. No effusions or acute bony abnormality. IMPRESSION: Cardiomegaly, vascular congestion. Consolidation in the right upper lung concerning for pneumonia. Followup PA  and lateral chest X-ray is recommended in 3-4 weeks following trial of antibiotic therapy to ensure resolution and exclude underlying malignancy. Electronically Signed   By: Charlett Nose M.D.   On: 05/14/2018 11:16    ____________________________________________   PROCEDURES  Procedure(s) performed: None  Procedures  Critical Care performed: Yes, see critical care note(s) ____________________________________________   INITIAL IMPRESSION / ASSESSMENT AND PLAN / ED COURSE  Pertinent labs & imaging results that were available during my care of the patient were reviewed by me and considered in my medical decision making (see chart for details).  77 y.o. male with several days of fever, cough and shortness of breath.  Overall, the patient's symptoms are most consistent with an infectious process, including pneumonia, he viral URI or influenza.  Given his lower extremity swelling and cardiac history, however, we will also do cardiac evaluation with a troponin, BNP and chest x-ray.  The patient will be given empiric antibiotics and intravenous fluids at this  time.  We will do an ambulatory pulse oximetry test.  Plan reevaluation for final disposition.  ----------------------------------------- 12:42 PM on 05/14/2018 -----------------------------------------  The patient was able to maintain oxygen saturations of 94% with ambulation; however, he did become symptomatic with walking.  His work-up reveals a right upper lobe pneumonia.  As well as an elevated BNP.  I have given him intravenous azithromycin and ceftriaxone and will plan to admit him to the hospital at this time.  CRITICAL CARE Performed by: Rockne Menghini   Total critical care time: 40 minutes  Critical care time was exclusive of separately billable procedures and treating other patients.  Critical care was necessary to treat or prevent imminent or life-threatening deterioration.  Critical care was time spent  personally by me on the following activities: development of treatment plan with patient and/or surrogate as well as nursing, discussions with consultants, evaluation of patient's response to treatment, examination of patient, obtaining history from patient or surrogate, ordering and performing treatments and interventions, ordering and review of laboratory studies, ordering and review of radiographic studies, pulse oximetry and re-evaluation of patient's condition.   ____________________________________________  FINAL CLINICAL IMPRESSION(S) / ED DIAGNOSES  Final diagnoses:  Hyperglycemia  Pneumonia of right upper lobe due to infectious organism [facility])  Community acquired pneumonia of right upper lobe of lung (HCC)  Acute congestive heart failure, unspecified heart failure type (HCC)  Sepsis, due to unspecified organism, unspecified whether acute organ dysfunction present [facility])         NEW MEDICATIONS STARTED DURING THIS VISIT:  Current Discharge Medication List        Rockne Menghini, MD 05/14/18 1243    Rockne Menghini, MD 05/14/18 1512

## 2018-05-14 NOTE — Plan of Care (Signed)
  Problem: Health Behavior/Discharge Planning: Goal: Ability to manage health-related needs will improve Outcome: Progressing   Problem: Activity: Goal: Risk for activity intolerance will decrease Outcome: Progressing   Problem: Safety: Goal: Ability to remain free from injury will improve Outcome: Progressing   

## 2018-05-14 NOTE — ED Notes (Addendum)
First Nurse Note: Interpreter video used to ascertain why patient is here in ED. Spanish Interpreter requested.  Patient here with fever, cough, and chest pain.  Patient alert, color good.

## 2018-05-14 NOTE — ED Notes (Signed)
Pt speaks in full and complete sentences without distress. Interpreter arrived and used person instead of tablet for interpretation of triage

## 2018-05-14 NOTE — Progress Notes (Addendum)
Interpreter was utilized [employer] [phone]. Discussed with pt the plan for tonight and asked if he have any question. As per pt non as of the moment. Will continue to monitor.  Update 0030: Pt reported SOB. Interpreter was utilized [employer] # [phone] )and explained that pt need to be on bed alarm and needs to call staff if need to use bathroom. Respiratory was also called for breathing treatment. Pt agreed. Will continue to monitor.  Update 0155:Interpreter [employer] # [id number] was utilized. Pt was re-assesed about his right shoulder pain after tylenol adminsitration. Pt states hes pain free right now. Pt did states still SOB and respiratory haven't come yet. Will give breathingt tx. Will continue to monitor.

## 2018-05-14 NOTE — Plan of Care (Deleted)
  Problem: Activity: Goal: Risk for activity intolerance will decrease Outcome: Progressing   

## 2018-05-14 NOTE — ED Notes (Signed)
Ambulated - O2 sat 95%

## 2018-05-15 LAB — BASIC METABOLIC PANEL
Anion gap: 11 (ref 5–15)
BUN: 25 mg/dL — ABNORMAL HIGH (ref 8–23)
CALCIUM: 8.4 mg/dL — AB (ref 8.9–10.3)
CO2: 22 mmol/L (ref 22–32)
CREATININE: 1.24 mg/dL (ref 0.61–1.24)
Chloride: 102 mmol/L (ref 98–111)
GFR calc Af Amer: >60 mL/min (ref >60)
GFR calc non Af Amer: 54 mL/min — ABNORMAL LOW (ref >60)
GLUCOSE: 146 mg/dL — AB (ref 70–99)
Potassium: 4 mmol/L (ref 3.5–5.1)
Sodium: 135 mmol/L (ref 135–145)

## 2018-05-15 LAB — GLUCOSE, CAPILLARY
Glucose-Capillary: 113 mg/dL — ABNORMAL HIGH (ref 70–99)
Glucose-Capillary: 250 mg/dL — ABNORMAL HIGH (ref 70–99)
Glucose-Capillary: 276 mg/dL — ABNORMAL HIGH (ref 70–99)
Glucose-Capillary: 229 mg/dL — ABNORMAL HIGH (ref 70–99)

## 2018-05-15 LAB — CBC
HEMATOCRIT: 34.3 % — AB (ref 39.0–52.0)
HEMOGLOBIN: 11.6 g/dL — AB (ref 13.0–17.0)
MCH: 31.9 pg (ref 26.0–34.0)
MCHC: 33.8 g/dL (ref 30.0–36.0)
MCV: 94.2 fL (ref 80.0–100.0)
Platelets: 161 10*3/uL (ref 150–400)
RBC: 3.64 MIL/uL — ABNORMAL LOW (ref 4.22–5.81)
RDW: 12.7 % (ref 11.5–15.5)
WBC: 14.7 10*3/uL — AB (ref 4.0–10.5)
nRBC: 0 % (ref 0.0–0.2)

## 2018-05-15 LAB — LACTIC ACID, PLASMA: LACTIC ACID, VENOUS: 1.4 mmol/L (ref 0.5–1.9)

## 2018-05-15 MED ORDER — SODIUM CHLORIDE 0.9 % IV SOLN
INTRAVENOUS | Status: DC | PRN
  Administered 2018-05-15 – 2018-05-16 (×2): 500 mL via INTRAVENOUS

## 2018-05-15 MED ORDER — IPRATROPIUM-ALBUTEROL 0.5-2.5 (3) MG/3ML IN SOLN
3.0000 mL | Freq: Three times a day (TID) | RESPIRATORY_TRACT | Status: DC
  Administered 2018-05-15 – 2018-05-16 (×2): 3 mL via RESPIRATORY_TRACT
  Filled 2018-05-15 (×2): qty 3

## 2018-05-15 MED ORDER — RIVAROXABAN 15 MG PO TABS
15.0000 mg | ORAL_TABLET | Freq: Every day | ORAL | Status: DC
  Administered 2018-05-15 – 2018-05-16 (×2): 15 mg via ORAL
  Filled 2018-05-15 (×3): qty 1

## 2018-05-15 NOTE — Progress Notes (Signed)
Rivaroxaban dose reduced from 20 mg po daily to 15 mg po daily due to CrCl 15 to 50 mL/min and indication AF.   Tzivia Oneil A. [place], [place].D., BCPS Clinical Pharmacist 05/15/2017 11:20

## 2018-05-15 NOTE — Progress Notes (Signed)
Interpreter was utilized [employer] # [id number]. On assessment pt request to have his xanax together with his schedule meds at 2200. Robitussin and xanax was adminstered. Will continue to monitor.

## 2018-05-15 NOTE — Progress Notes (Signed)
Spanish interpreter [place] [phone] to translate for patient's c/o pain and pain medication administration.

## 2018-05-15 NOTE — Progress Notes (Signed)
[facility] - [place] at [facility]   PATIENT NAME: Evan Ruiz    MR#:  [id number]  DATE OF BIRTH:  Nov 16, 1940  SUBJECTIVE: Admitted for pneumonia, still has shortness of breath, wheezing.  No fever or chest pain.  Used language line.  CHIEF COMPLAINT:   Chief Complaint  Patient presents with  . Shortness of Breath  . Cough    REVIEW OF SYSTEMS:   ROS CONSTITUTIONAL: No fever, fatigue or weakness.  EYES: No blurred or double vision.  EARS, NOSE, AND THROAT: No tinnitus or ear pain.  RESPIRATORY: No cough, shortness of breath, wheezing or hemoptysis.  CARDIOVASCULAR: No chest pain, orthopnea, edema.  GASTROINTESTINAL: No nausea, vomiting, diarrhea or abdominal pain.  GENITOURINARY: No dysuria, hematuria.  ENDOCRINE: No polyuria, nocturia,  HEMATOLOGY: No anemia, easy bruising or bleeding SKIN: No rash or lesion. MUSCULOSKELETAL: No joint pain or arthritis.   NEUROLOGIC: No tingling, numbness, weakness.  PSYCHIATRY: No anxiety or depression.   DRUG ALLERGIES:  No Known Allergies  VITALS:  Blood pressure (!) 153/61, pulse 71, temperature (!) 97.5 F (36.4 C), temperature source Oral, resp. rate 18, height 5\' 5"  (1.651 m), weight 71.8 kg, SpO2 97 %.  PHYSICAL EXAMINATION:  GENERAL:  77 y.o.-year-old patient lying in the bed with no acute distress.  EYES: Pupils equal, round, reactive to light and accommodation. No scleral icterus. Extraocular muscles intact.  HEENT: Head atraumatic, normocephalic. Oropharynx and nasopharynx clear.  NECK:  Supple, no jugular venous distention. No thyroid enlargement, no tenderness.  LUNGS:  has expiratory wheeze in all lung fields but not using accessory muscles of respiration.  CARDIOVASCULAR: S1, S2 normal. No murmurs, rubs, or gallops.  ABDOMEN: Soft, nontender, nondistended. Bowel sounds present. No organomegaly or mass.  EXTREMITIES: No pedal edema, cyanosis, or clubbing.  NEUROLOGIC: Cranial nerves II through  XII are intact. Muscle strength 5/5 in all extremities. Sensation intact. Gait not checked.  PSYCHIATRIC: The patient is alert and oriented x 3.  SKIN: No obvious rash, lesion, or ulcer.    LABORATORY PANEL:   CBC Recent Labs  Lab 05/15/18 0208  WBC 14.7*  HGB 11.6*  HCT 34.3*  PLT 161   ------------------------------------------------------------------------------------------------------------------  Chemistries  Recent Labs  Lab 05/14/18 1046 05/15/18 0208  NA 133* 135  K 4.2 4.0  CL 98 102  CO2 24 22  GLUCOSE 241* 146*  BUN 22 25*  CREATININE 1.14 1.24  CALCIUM 8.7* 8.4*  AST 35  --   ALT 29  --   ALKPHOS 74  --   BILITOT 0.9  --    ------------------------------------------------------------------------------------------------------------------  Cardiac Enzymes Recent Labs  Lab 05/14/18 1046  TROPONINI <0.03   ------------------------------------------------------------------------------------------------------------------  RADIOLOGY:  Dg Chest 2 View  Result Date: 05/14/2018 CLINICAL DATA:  Chest pain, cough, shortness of breath EXAM: CHEST - 2 VIEW COMPARISON:  09/22/2014 FINDINGS: Consolidation in the right upper lung concerning for pneumonia. Cardiomegaly. Left pacer remains in place, unchanged. Mild vascular congestion. No effusions or acute bony abnormality. IMPRESSION: Cardiomegaly, vascular congestion. Consolidation in the right upper lung concerning for pneumonia. Followup PA and lateral chest X-ray is recommended in 3-4 weeks following trial of antibiotic therapy to ensure resolution and exclude underlying malignancy. Electronically Signed   By: Charlett Nose M.D.   On: 05/14/2018 11:16    EKG:   Orders placed or performed during the hospital encounter of 05/14/18  . EKG 12-Lead  . EKG 12-Lead  . ED EKG  . ED EKG  ASSESSMENT AND PLAN:   Community-acquired pneumonia: Patient is on Rocephin, Zithromax, clinically improving, 2.  Reactive  airway disease: Patient still wheezing, continue bronchodilators, Pulmicort nebs, used language Interpretation.. For Spanish interpretation.  #3 diabetes mellitus type 2: Without complication, patient is not eating much, DC NPH insulin, continue sliding scale insulin with coverage, resume metformin from tomorrow. 4.  Chronic systolic heart failure, continue Coreg, Lasix, losartan.  Euvolemic now. 5.  History of chronic atrial fibrillation, patient is on Xarelto, Coreg and had history of pacemaker.   All the records are reviewed and case discussed with Care Management/Social Workerr. Management plans discussed with the patient, family and they are in agreement.  CODE STATUS: Full code  TOTAL TIME TAKING CARE OF THIS PATIENT: 38 minutes.   POSSIBLE D/C IN 1-2 DAYS, DEPENDING ON CLINICAL CONDITION.   Katha Hamming M.D on 05/15/2018 at 10:31 AM  Between 7am to 6pm - Pager - [phone]  After 6pm go to [email or web address] - password [email or web address]  [facility]  Office  [phone]  CC: Primary care physician; Patient, No Pcp Per   Note: This dictation was prepared with Dragon dictation along with smaller phrase technology. Any transcriptional errors that result from this process are unintentional.

## 2018-05-16 ENCOUNTER — Inpatient Hospital Stay: Payer: Medicare Other

## 2018-05-16 LAB — GLUCOSE, CAPILLARY
GLUCOSE-CAPILLARY: 281 mg/dL — AB (ref 70–99)
GLUCOSE-CAPILLARY: 415 mg/dL — AB (ref 70–99)
GLUCOSE-CAPILLARY: 379 mg/dL — AB (ref 70–99)
Glucose-Capillary: 160 mg/dL — ABNORMAL HIGH (ref 70–99)
Glucose-Capillary: 242 mg/dL — ABNORMAL HIGH (ref 70–99)

## 2018-05-16 MED ORDER — IPRATROPIUM-ALBUTEROL 0.5-2.5 (3) MG/3ML IN SOLN
3.0000 mL | Freq: Four times a day (QID) | RESPIRATORY_TRACT | Status: DC
  Administered 2018-05-16 – 2018-05-17 (×4): 3 mL via RESPIRATORY_TRACT
  Filled 2018-05-16 (×3): qty 3

## 2018-05-16 MED ORDER — INSULIN ASPART 100 UNIT/ML ~~LOC~~ SOLN: 0.0000 [IU] | Freq: Three times a day (TID) | SUBCUTANEOUS | Status: DC

## 2018-05-16 MED ORDER — INSULIN ASPART 100 UNIT/ML ~~LOC~~ SOLN
0.0000 [IU] | Freq: Three times a day (TID) | SUBCUTANEOUS | Status: DC
  Administered 2018-05-16: 9 [IU] via SUBCUTANEOUS
  Administered 2018-05-17: 5 [IU] via SUBCUTANEOUS
  Filled 2018-05-16 (×2): qty 1

## 2018-05-16 MED ORDER — SODIUM CHLORIDE 0.9% FLUSH
3.0000 mL | Freq: Two times a day (BID) | INTRAVENOUS | Status: DC
  Administered 2018-05-16 – 2018-05-17 (×3): 3 mL via INTRAVENOUS

## 2018-05-16 MED ORDER — METHYLPREDNISOLONE SODIUM SUCC 125 MG IJ SOLR
60.0000 mg | INTRAMUSCULAR | Status: DC
  Administered 2018-05-16: 60 mg via INTRAVENOUS
  Filled 2018-05-16: qty 2

## 2018-05-16 MED ORDER — FUROSEMIDE 10 MG/ML IJ SOLN
40.0000 mg | Freq: Once | INTRAMUSCULAR | Status: AC
  Administered 2018-05-16: 40 mg via INTRAVENOUS
  Filled 2018-05-16: qty 4

## 2018-05-16 MED ORDER — SODIUM CHLORIDE 0.9% FLUSH: 3.0000 mL | INTRAVENOUS | Status: DC | PRN

## 2018-05-16 NOTE — Plan of Care (Signed)
  Problem: Health Behavior/Discharge Planning: Goal: Ability to manage health-related needs will improve Outcome: Progressing   Problem: Activity: Goal: Risk for activity intolerance will decrease Outcome: Progressing   

## 2018-05-16 NOTE — Progress Notes (Signed)
O2 sat on room air was 94%

## 2018-05-16 NOTE — Progress Notes (Signed)
[facility] - [place] at [facility]   PATIENT NAME: Evan Ruiz    MR#:  [id number]  DATE OF BIRTH:  26-Jan-1941  SUBJECTIVE: .  Patient states that he feels better today, sitting and eating breakfast.  Nurse is concerned about his respiratory rate being high.  Patient with full sentences, denies any worsening of his breathing.  I used the language line for Spanish" today.  CHIEF COMPLAINT:   Chief Complaint  Patient presents with  . Shortness of Breath  . Cough    REVIEW OF SYSTEMS:   ROS CONSTITUTIONAL: No fever, fatigue or weakness.  EYES: No blurred or double vision.  EARS, NOSE, AND THROAT: No tinnitus or ear pain.  RESPIRATORY: Cough, shortness of breath.   cARDIOVASCULAR: No chest pain, orthopnea, edema.  GASTROINTESTINAL: No nausea, vomiting, diarrhea or abdominal pain.  GENITOURINARY: No dysuria, hematuria.  ENDOCRINE: No polyuria, nocturia,  HEMATOLOGY: No anemia, easy bruising or bleeding SKIN: No rash or lesion. MUSCULOSKELETAL: No joint pain or arthritis.   NEUROLOGIC: No tingling, numbness, weakness.  PSYCHIATRY: No anxiety or depression.   DRUG ALLERGIES:  No Known Allergies  VITALS:  Blood pressure 128/64, pulse 70, temperature 99.2 F (37.3 C), temperature source Oral, resp. rate (!) 36, height 5\' 5"  (1.651 m), weight 71.8 kg, SpO2 97 %.  PHYSICAL EXAMINATION:  GENERAL:  77 y.o.-year-old patient lying in the bed with no acute distress.  EYES: Pupils equal, round, reactive to light and accommodation. No scleral icterus. Extraocular muscles intact.  HEENT: Head atraumatic, normocephalic. Oropharynx and nasopharynx clear.  NECK:  Supple, no jugular venous distention. No thyroid enlargement, no tenderness.  LUNGS: Decreased wheezing in the lungs.  CARDIOVASCULAR: S1, S2 normal. No murmurs, rubs, or gallops.  ABDOMEN: Soft, nontender, nondistended. Bowel sounds present. No organomegaly or mass.  EXTREMITIES: No pedal edema, cyanosis,  or clubbing.  NEUROLOGIC: Cranial nerves II through XII are intact. Muscle strength 5/5 in all extremities. Sensation intact. Gait not checked.  PSYCHIATRIC: The patient is alert and oriented x 3.  SKIN: No obvious rash, lesion, or ulcer.    LABORATORY PANEL:   CBC Recent Labs  Lab 05/15/18 0208  WBC 14.7*  HGB 11.6*  HCT 34.3*  PLT 161   ------------------------------------------------------------------------------------------------------------------  Chemistries  Recent Labs  Lab 05/14/18 1046 05/15/18 0208  NA 133* 135  K 4.2 4.0  CL 98 102  CO2 24 22  GLUCOSE 241* 146*  BUN 22 25*  CREATININE 1.14 1.24  CALCIUM 8.7* 8.4*  AST 35  --   ALT 29  --   ALKPHOS 74  --   BILITOT 0.9  --    ------------------------------------------------------------------------------------------------------------------  Cardiac Enzymes Recent Labs  Lab 05/14/18 1046  TROPONINI <0.03   ------------------------------------------------------------------------------------------------------------------  RADIOLOGY:  Dg Chest Port 1 View  Result Date: 05/16/2018 CLINICAL DATA:  Shortness of breath. EXAM: PORTABLE CHEST 1 VIEW COMPARISON:  Chest x-ray dated May 14, 2018. FINDINGS: Unchanged left chest wall pacemaker. Stable cardiomegaly. Unchanged pulmonary vascular congestion. Similar-appearing consolidation within the right upper lobe. Slightly more conspicuous patchy opacities in the right lower lobe. No pleural effusion or pneumothorax. No acute osseous abnormality. IMPRESSION: 1. Unchanged right upper lobe consolidation remains concerning for pneumonia. 2. New mild patchy opacities in the right lower lobe could reflect atelectasis or additional site of infection. Electronically Signed   By: Obie Dredge M.D.   On: 05/16/2018 10:50    EKG:   Orders placed or performed during the hospital encounter of 05/14/18  .  EKG 12-Lead  . EKG 12-Lead  . ED EKG  . ED EKG    ASSESSMENT  AND PLAN:   Community-acquired pneumonia: Patient is on Rocephin, Zithromax chest x-ray repeated today morning showed right upper lobe pneumonia that has presently: Patient also has right lower lobe pneumonia looks like his pneumonia is worsening.  Continue IV antibiotics, follow clinical course, continue bronchodilators, add a small dose of IV steroids., 2.  Reactive airway disease: Patient still wheezing, continue bronchodilators, Pulmicort nebs, language line for Spanish interpretation.   #3 diabetes mellitus type 2: Eating better today, resume metformin, sliding scale coverage.  Blood sugar more than 200.   4.  Chronic systolic heart failure, continue Coreg, Lasix, losartan.  Euvolemic now.  Chest this morning shows no CHF.  5.  History of chronic atrial fibrillation, patient is on Xarelto, Coreg and had history of pacemaker.   All the records are reviewed and case discussed with Care Management/Social Workerr. Management plans discussed with the patient, family and they are in agreement.  CODE STATUS: Full code  TOTAL TIME TAKING CARE OF THIS PATIENT: 38 minutes.   POSSIBLE D/C IN 1-2 DAYS, DEPENDING ON CLINICAL CONDITION.   Katha Hamming M.D on 05/16/2018 at 11:59 AM  Between 7am to 6pm - Pager - [phone]  After 6pm go to [email or web address] - password [email or web address]  [facility]  Office  [phone]  CC: Primary care physician; Patient, No Pcp Per   Note: This dictation was prepared with Dragon dictation along with smaller phrase technology. Any transcriptional errors that result from this process are unintentional.

## 2018-05-17 LAB — GLUCOSE, CAPILLARY
Glucose-Capillary: 158 mg/dL — ABNORMAL HIGH (ref 70–99)
Glucose-Capillary: 285 mg/dL — ABNORMAL HIGH (ref 70–99)

## 2018-05-17 MED ORDER — AMOXICILLIN-POT CLAVULANATE 875-125 MG PO TABS: 1.0000 | ORAL_TABLET | Freq: Two times a day (BID) | ORAL | 0 refills | Status: DC

## 2018-05-17 MED ORDER — AMOXICILLIN-POT CLAVULANATE 875-125 MG PO TABS: 1.0000 | ORAL_TABLET | Freq: Two times a day (BID) | ORAL | 0 refills | Status: AC

## 2018-05-17 MED ORDER — INSULIN GLARGINE 100 UNIT/ML ~~LOC~~ SOLN
10.0000 [IU] | Freq: Every day | SUBCUTANEOUS | Status: DC
  Administered 2018-05-17: 10 [IU] via SUBCUTANEOUS
  Filled 2018-05-17: qty 0.1

## 2018-05-17 MED ORDER — METHYLPREDNISOLONE SODIUM SUCC 40 MG IJ SOLR
40.0000 mg | INTRAMUSCULAR | Status: DC
  Administered 2018-05-17: 40 mg via INTRAVENOUS
  Filled 2018-05-17: qty 1

## 2018-05-17 MED ORDER — PREDNISONE 10 MG (21) PO TBPK: ORAL_TABLET | ORAL | 0 refills | Status: AC

## 2018-05-17 NOTE — Care Management Important Message (Signed)
Copy of signed IM left with patient in room.  

## 2018-05-17 NOTE — Discharge Summary (Signed)
Evan Ruiz, is a 77 y.o. male  DOB 09-Feb-1941  MRN [id number].  Admission date:  05/14/2018  Admitting Physician  Houston Siren, MD  Discharge Date:  05/17/2018   Primary MD  Patient, No Pcp Per  Recommendations for primary care physician for things to follow:  Follow-up with PCP at [facility], follow-up with primary cardiologist at [facility].  Spoke with help of Engineer, structural, wife told me that she is going to call [facility] and make appointments herself.   Admission Diagnosis  Hyperglycemia [R73.9] Pneumonia of right upper lobe due to infectious organism (HCC) [J18.1] Community acquired pneumonia of right upper lobe of lung (HCC) [J18.1] Acute congestive heart failure, unspecified heart failure type (HCC) [I50.9] Sepsis, due to unspecified organism, unspecified whether acute organ dysfunction present [facility]) [A41.9]   Discharge Diagnosis  Hyperglycemia [R73.9] Pneumonia of right upper lobe due to infectious organism (HCC) [J18.1] Community acquired pneumonia of right upper lobe of lung (HCC) [J18.1] Acute congestive heart failure, unspecified heart failure type (HCC) [I50.9] Sepsis, due to unspecified organism, unspecified whether acute organ dysfunction present (HCC) [A41.9]    Active Problems:   Pneumonia      Past Medical History:  Diagnosis Date  . Atrial fibrillation (HCC)   . Diabetes mellitus without complication (HCC)   . Hyperlipidemia   . Hypertension   . Pacemaker     History reviewed. No pertinent surgical history.     History of present illness and  Hospital Course:     Kindly see H&P for history of present illness and admission details, please review complete Labs, Consult reports and Test reports for all details in brief  HPI  from the history and physical done on the day of admission  77 year old  male patient with history of hypertension, hyperlipidemia, CAD, PCI, chronic A. fib, biventricular pacemaker, diabetes mellitus type 2, chronic systolic heart failure comes in because of shortness of breath, cough for the past 10 days, patient chest x-ray showed pneumonia on the right side, admitted for the same.  Hospital Course   #1 .multifocal pneumonia: Patient chest x-ray showed right upper lobe and right lower lobe pneumonias, patient had lots of wheezing when he came improved with steroids, antibiotics, today he feels well, spoke with Spanish interpreter and he wants to go home.  Discharge home with Augmentin for 7 days, prednisone dose taper, albuterol MDI 2 puffs every 6 hours 4 to 3 days and then continue to every 12 hours as needed for wheezing. 2.  Chronic atrial fibrillation, controlled, patient followed by [facility] cardiology, continue coreg on Xarelto. 3.  Diabetes mellitus type 2: Patient is on NPH insulin 70/30 10 units twice daily, metformin 1 g p.o. twice daily. #4 chronic systolic heart failure, euvolemic, continue Lasix 40 mg p.o. as needed for leg swelling.  Patient also on losartan 50 mg p.o. daily continue Coreg 6.25 mg p.o. twice daily.  Discharge Condition: Stable   Follow UP  Follow-up Information    [facility] Follow up in 1 week(s).   Why:  wife said she can make appointment            Discharge Instructions  and  Discharge Medications      Allergies as of 05/17/2018   No Known Allergies     Medication List    TAKE these medications   albuterol 108 (90 Base) MCG/ACT inhaler Commonly known as:  PROVENTIL HFA;VENTOLIN HFA Inhale 2 puffs into the lungs every 6 (six) hours as needed for  wheezing or shortness of breath.   amLODipine 10 MG tablet Commonly known as:  NORVASC Take 10 mg by mouth daily.   amoxicillin-clavulanate 875-125 MG tablet Commonly known as:  AUGMENTIN Take 1 tablet by mouth 2 (two) times daily for 14 days.   aspirin 81 MG chewable  tablet Chew 81 mg by mouth daily.   atorvastatin 40 MG tablet Commonly known as:  LIPITOR Take 40 mg by mouth daily.   carvedilol 6.25 MG tablet Commonly known as:  COREG Take 6.25 mg by mouth 2 (two) times daily with a meal.   furosemide 40 MG tablet Commonly known as:  LASIX Take 40 mg by mouth daily as needed (leg swelling).   HUMULIN 70/30 (70-30) 100 UNIT/ML injection Generic drug:  insulin NPH-regular Human Inject 10 Units into the skin 2 (two) times daily.   losartan 50 MG tablet Commonly known as:  COZAAR Take 100 mg by mouth daily.   metFORMIN 500 MG tablet Commonly known as:  GLUCOPHAGE Take 1,000 mg by mouth 2 (two) times daily with a meal.   predniSONE 10 MG (21) Tbpk tablet Commonly known as:  STERAPRED UNI-PAK 21 TAB Taper by 10 mg p.o. daily   rivaroxaban 20 MG Tabs tablet Commonly known as:  XARELTO Take 20 mg by mouth daily with supper.         Diet and Activity recommendation: See Discharge Instructions above   Consults obtained -none   Major procedures and Radiology Reports - PLEASE review detailed and final reports for all details, in brief -     Dg Chest 2 View  Result Date: 05/14/2018 CLINICAL DATA:  Chest pain, cough, shortness of breath EXAM: CHEST - 2 VIEW COMPARISON:  09/22/2014 FINDINGS: Consolidation in the right upper lung concerning for pneumonia. Cardiomegaly. Left pacer remains in place, unchanged. Mild vascular congestion. No effusions or acute bony abnormality. IMPRESSION: Cardiomegaly, vascular congestion. Consolidation in the right upper lung concerning for pneumonia. Followup PA and lateral chest X-ray is recommended in 3-4 weeks following trial of antibiotic therapy to ensure resolution and exclude underlying malignancy. Electronically Signed   By: Charlett Nose M.D.   On: 05/14/2018 11:16   Dg Chest Port 1 View  Result Date: 05/16/2018 CLINICAL DATA:  Shortness of breath. EXAM: PORTABLE CHEST 1 VIEW COMPARISON:  Chest x-ray  dated May 14, 2018. FINDINGS: Unchanged left chest wall pacemaker. Stable cardiomegaly. Unchanged pulmonary vascular congestion. Similar-appearing consolidation within the right upper lobe. Slightly more conspicuous patchy opacities in the right lower lobe. No pleural effusion or pneumothorax. No acute osseous abnormality. IMPRESSION: 1. Unchanged right upper lobe consolidation remains concerning for pneumonia. 2. New mild patchy opacities in the right lower lobe could reflect atelectasis or additional site of infection. Electronically Signed   By: Obie Dredge M.D.   On: 05/16/2018 10:50    Micro Results    Recent Results (from the past 240 hour(s))  Blood culture (routine x 2)     Status: None (Preliminary result)   Collection Time: 05/14/18 12:01 PM  Result Value Ref Range Status   Specimen Description BLOOD RIGHT ANTECUBITAL  Final   Special Requests   Final    BOTTLES DRAWN AEROBIC AND ANAEROBIC Blood Culture results may not be optimal due to an excessive volume of blood received in culture bottles   Culture   Final    NO GROWTH 3 DAYS Performed at [facility], [address], [place], [place] [address]    Report Status PENDING  Incomplete  Culture, blood (Routine X 2) w Reflex to ID Panel     Status: None (Preliminary result)   Collection Time: 05/15/18  2:07 AM  Result Value Ref Range Status   Specimen Description LEFT ANTECUBITAL  Final   Special Requests NONE  Final   Culture   Final    NO GROWTH 2 DAYS Performed at [facility], [address], [place], [place] [address]    Report Status PENDING  Incomplete       Today   Subjective:   Dustine Stickler today has no headache,no chest abdominal pain,no new weakness tingling or numbness, feels much better wants to go home today.   Objective:   Blood pressure 130/67, pulse 69, temperature 97.9 F (36.6 C), temperature source Oral, resp. rate 20, height 5\' 5"  (1.651 m), weight 72.1 kg, SpO2 98  %.   Intake/Output Summary (Last 24 hours) at 05/17/2018 1008 Last data filed at 05/17/2018 0953 Gross per 24 hour  Intake 606 ml  Output 200 ml  Net 406 ml    Exam Awake Alert, Oriented x 3, No new F.N deficits, Normal affect [place].AT,PERRAL Supple Neck,No JVD, No cervical lymphadenopathy appriciated.  Symmetrical Chest wall movement, Good air movement bilaterally, CTAB RRR,No Gallops,Rubs or new Murmurs, No Parasternal Heave +ve B.Sounds, Abd Soft, Non tender, No organomegaly appriciated, No rebound -guarding or rigidity. No Cyanosis, Clubbing or edema, No new Rash or bruise  Data Review   CBC w Diff:  Lab Results  Component Value Date   WBC 14.7 (H) 05/15/2018   HGB 11.6 (L) 05/15/2018   HGB 14.8 12/23/2013   HCT 34.3 (L) 05/15/2018   HCT 43.6 12/23/2013   PLT 161 05/15/2018   PLT 155 12/23/2013    CMP:  Lab Results  Component Value Date   NA 135 05/15/2018   NA 135 (L) 12/23/2013   K 4.0 05/15/2018   K 4.1 12/23/2013   CL 102 05/15/2018   CL 106 12/23/2013   CO2 22 05/15/2018   CO2 24 12/23/2013   BUN 25 (H) 05/15/2018   BUN 16 12/23/2013   CREATININE 1.24 05/15/2018   CREATININE 1.23 12/23/2013   PROT 7.6 05/14/2018   ALBUMIN 4.0 05/14/2018   BILITOT 0.9 05/14/2018   ALKPHOS 74 05/14/2018   AST 35 05/14/2018   ALT 29 05/14/2018  .   Total Time in preparing paper work, data evaluation and todays exam - 35 minutes  Katha Hamming M.D on 05/17/2018 at 10:08 AM    Note: This dictation was prepared with Dragon dictation along with smaller phrase technology. Any transcriptional errors that result from this process are unintentional.

## 2018-05-17 NOTE — Plan of Care (Signed)

## 2018-05-17 NOTE — Progress Notes (Signed)
Patient given discharge instructions with family at bedside. IV taken out and tele monitor off. Patient verbalized understanding with no further questions or concerns. Patient going home via family vehicle.  

## 2018-05-19 LAB — CULTURE, BLOOD (ROUTINE X 2): CULTURE: NO GROWTH

## 2018-05-20 LAB — CULTURE, BLOOD (ROUTINE X 2): CULTURE: NO GROWTH

## 2019-09-17 IMAGING — CR DG CHEST 2V
2 series · 3 of 3 positions shown · non-contrast
Comparison: 09/22/2014

CLINICAL DATA: Chest pain, cough, shortness of breath

EXAM:
CHEST - 2 VIEW

[Series 2: chest lat · 0.14mm/px · 2 of 2 slices shown]
[im 1/2]
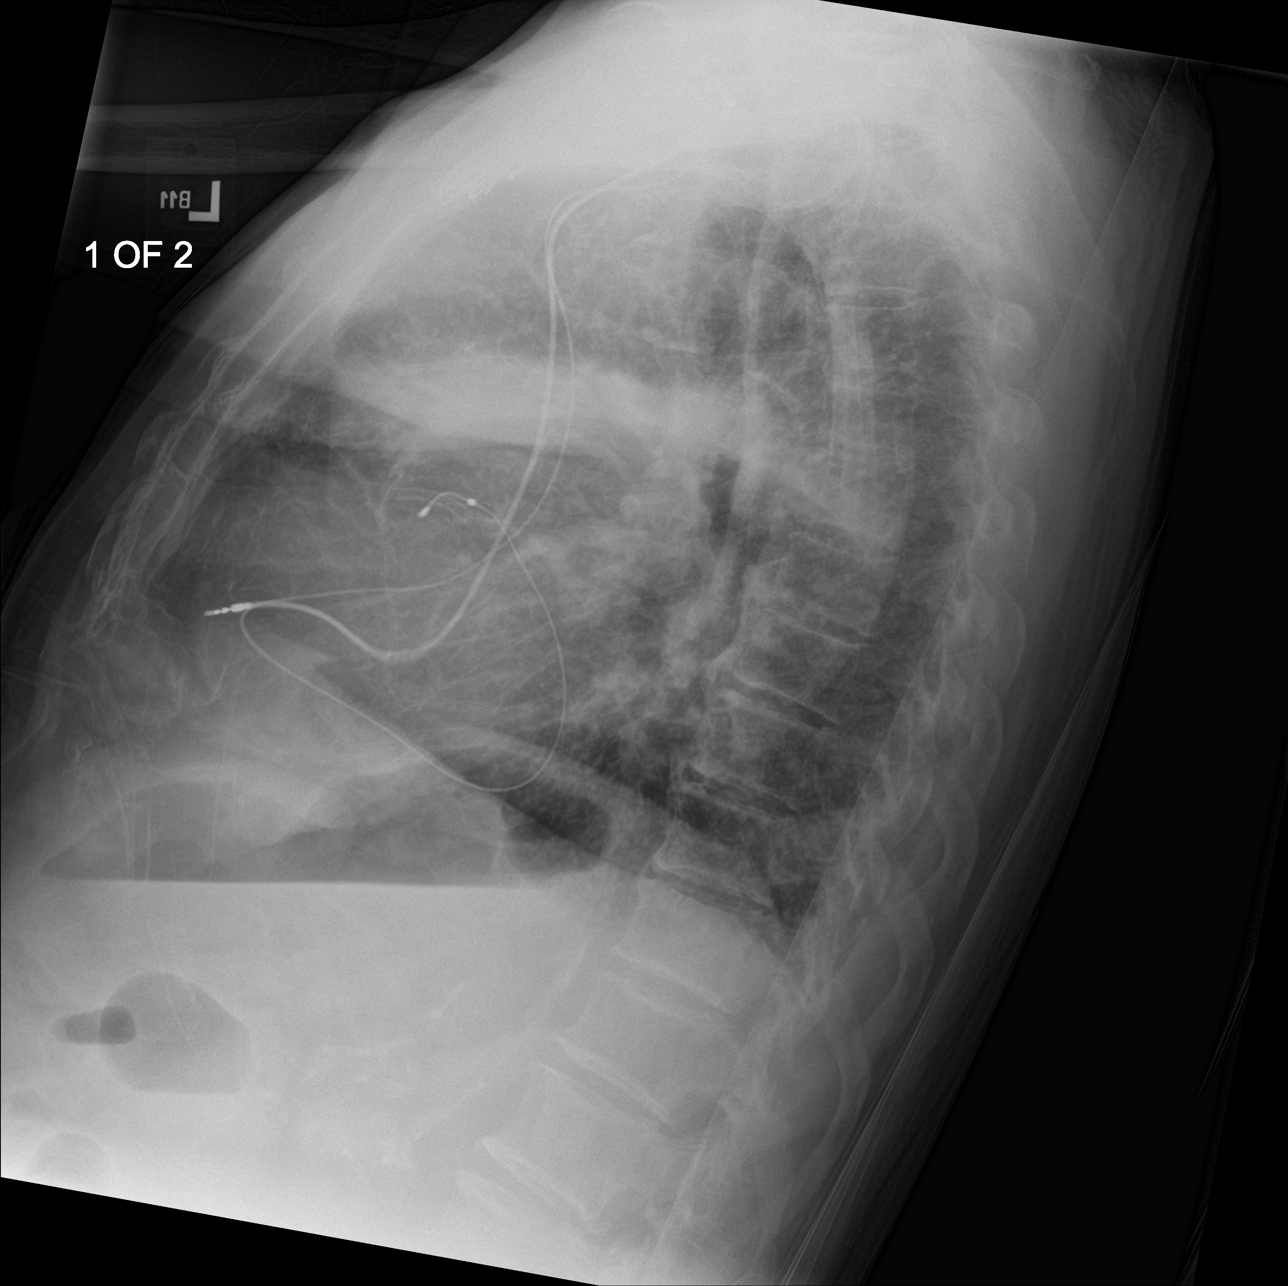
[im 2/2]
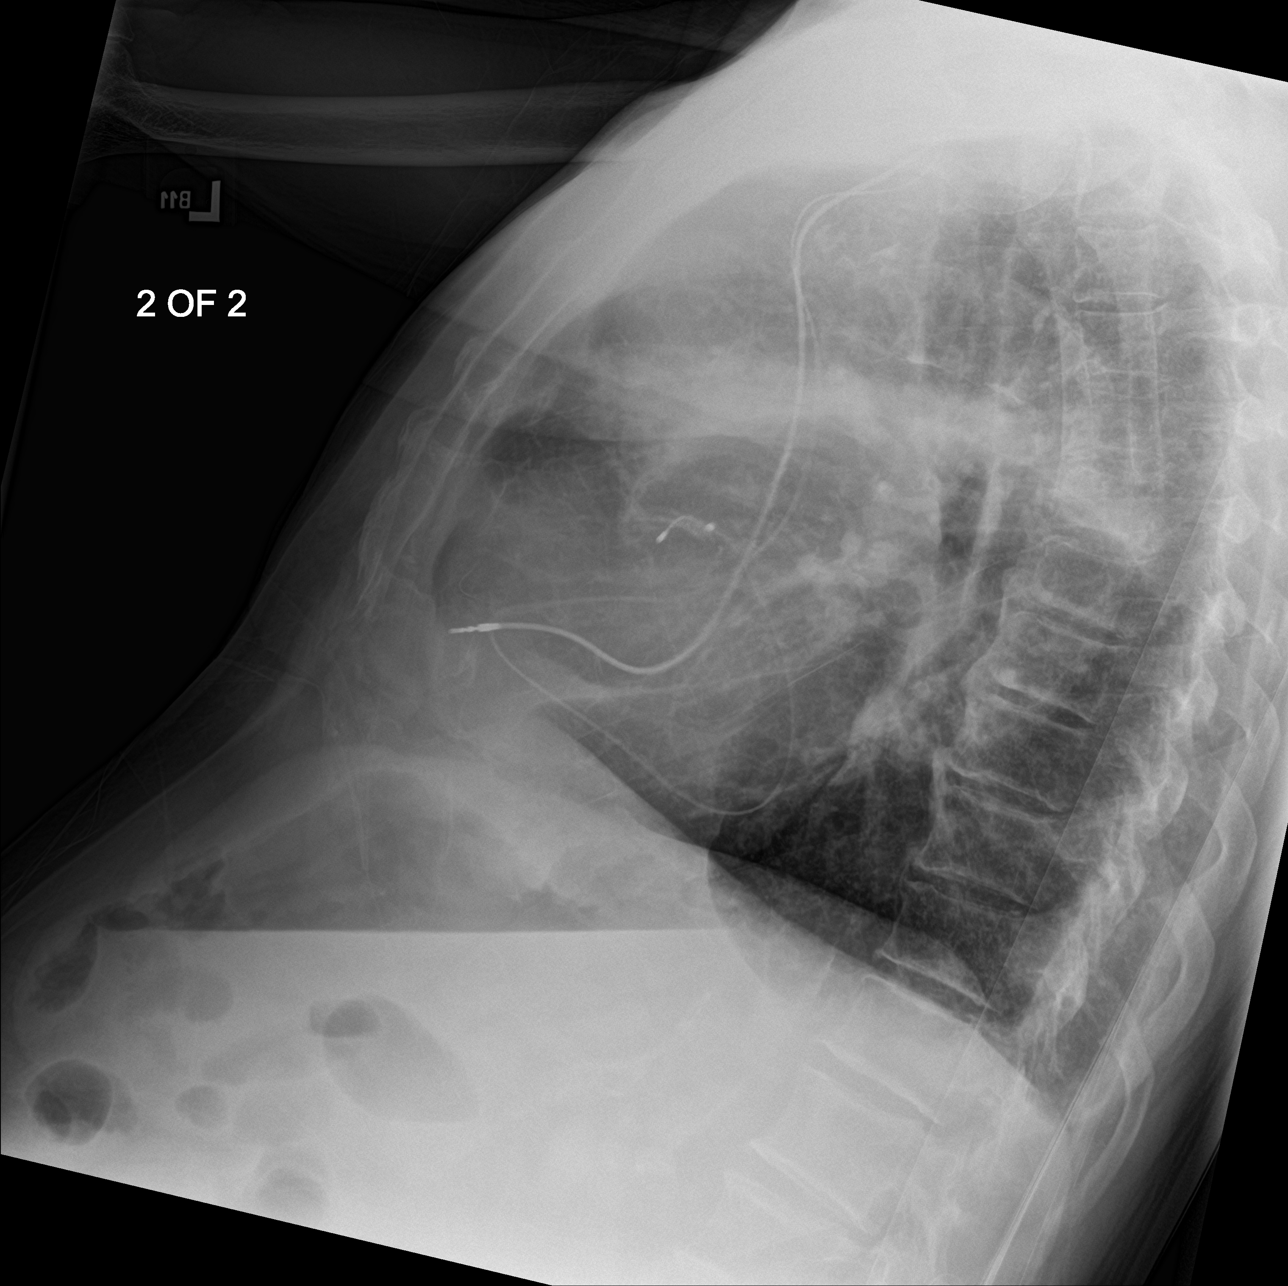

[chest ap]
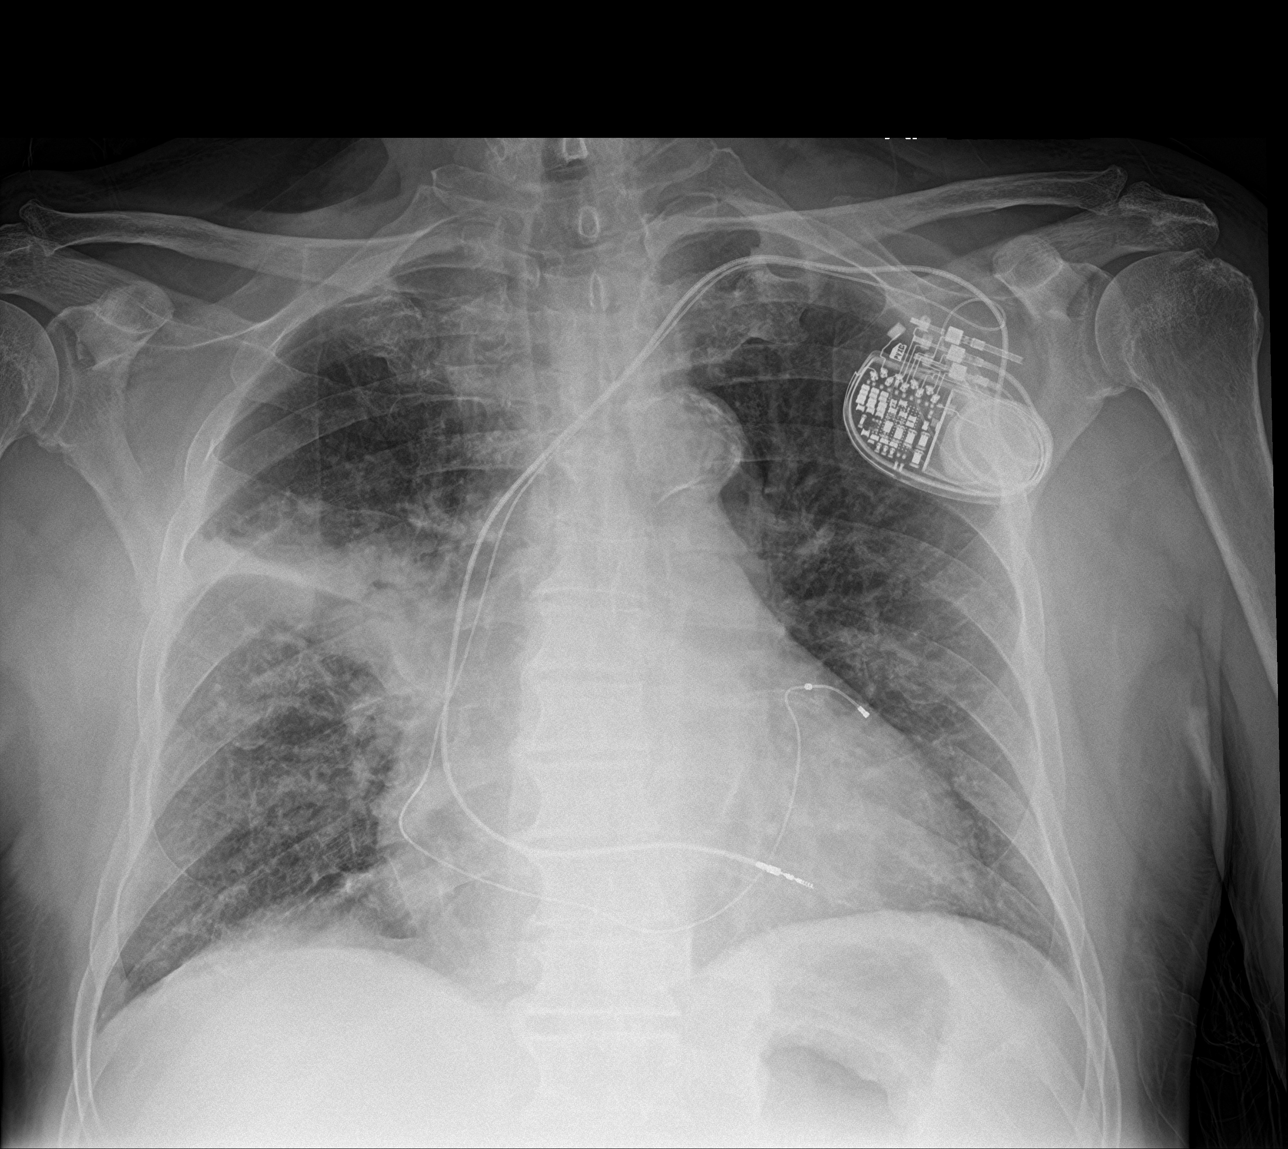

[3 of 3 positions shown; findings below may reference images not displayed]

FINDINGS: Consolidation in the right upper lung concerning for pneumonia.
Cardiomegaly. Left pacer remains in place, unchanged. Mild vascular
congestion. No effusions or acute bony abnormality.
IMPRESSION: Cardiomegaly, vascular congestion.

Consolidation in the right upper lung concerning for pneumonia.
Followup PA and lateral chest X-ray is recommended in 3-4 weeks
following trial of antibiotic therapy to ensure resolution and
exclude underlying malignancy.
